# Patient Record
Sex: Male | Born: 1935 | Race: White | Hispanic: No | Marital: Married
Health system: Midwestern US, Academic
[De-identification: ages and names within clinical notes are randomized; demographics above are authoritative.]

---

## 2016-10-05 ENCOUNTER — Encounter: Admit: 2016-10-05 | Discharge: 2016-10-05 | Payer: MEDICARE

## 2016-10-05 DIAGNOSIS — I482 Chronic atrial fibrillation, unspecified: ICD-10-CM

## 2016-10-05 DIAGNOSIS — Z7901 Long term (current) use of anticoagulants: Principal | ICD-10-CM

## 2016-10-05 NOTE — Progress Notes
Pt denies any change in medication or diet to explain reason for elevated INR.  He says he takes tylenol on occasion for back pain but that is not a change.    Will decrease coumadin by 10% as per protocol and have him take 5mg  on MW (instead of MWF) with 2.5mg  all other days.     He was not really wanting to check INR again in a week, but would consider 2 weeks.      Dosage of coumadin verified, pt verbalized understanding of instructions and when next INR is due to be drawn.

## 2016-10-19 ENCOUNTER — Encounter: Admit: 2016-10-19 | Discharge: 2016-10-19 | Payer: MEDICARE

## 2016-11-02 ENCOUNTER — Encounter: Admit: 2016-11-02 | Discharge: 2016-11-02 | Payer: MEDICARE

## 2016-11-23 ENCOUNTER — Encounter: Admit: 2016-11-23 | Discharge: 2016-11-23 | Payer: MEDICARE

## 2016-12-03 ENCOUNTER — Encounter: Admit: 2016-12-03 | Discharge: 2016-12-03 | Payer: MEDICARE

## 2016-12-03 MED ORDER — WARFARIN 5 MG PO TAB
ORAL_TABLET | Freq: Every day | ORAL | 3 refills | 90.00000 days | Status: AC
Start: 2016-12-03 — End: 2018-02-10

## 2016-12-21 ENCOUNTER — Encounter: Admit: 2016-12-21 | Discharge: 2016-12-21 | Payer: MEDICARE

## 2017-01-15 ENCOUNTER — Encounter: Admit: 2017-01-15 | Discharge: 2017-01-15 | Payer: MEDICARE

## 2017-01-15 MED ORDER — METOPROLOL TARTRATE 25 MG PO TAB
ORAL_TABLET | Freq: Two times a day (BID) | ORAL | 3 refills | 90.00000 days | Status: AC
Start: 2017-01-15 — End: 2018-01-16

## 2017-01-18 ENCOUNTER — Encounter: Admit: 2017-01-18 | Discharge: 2017-01-18 | Payer: MEDICARE

## 2017-01-18 NOTE — Progress Notes
LM for pt relative to results, dosage and date of next INR. Advised to call if questions or concerns.

## 2017-01-21 ENCOUNTER — Encounter: Admit: 2017-01-21 | Discharge: 2017-01-21 | Payer: MEDICARE

## 2017-01-21 LAB — COMPREHENSIVE METABOLIC PANEL
Lab: 0.9 — ABNORMAL LOW (ref 33.0–37.0)
Lab: 1.2 — ABNORMAL HIGH (ref 0.0–1.0)
Lab: 131
Lab: 138 — ABNORMAL LOW (ref 4.70–6.10)
Lab: 16
Lab: 3.5
Lab: 3.9
Lab: 7.1
Lab: 9 — ABNORMAL HIGH (ref 27.0–31.0)
Lab: 9.3
Lab: 96

## 2017-01-21 LAB — MYOGLOBIN-CHEM: Lab: 80 — ABNORMAL HIGH (ref 80.0–99.0)

## 2017-01-21 LAB — TROPONIN-I

## 2017-01-21 LAB — CBC: Lab: 8.7

## 2017-01-21 NOTE — Telephone Encounter
Wife called asking for a pacemaker check because pt does not think his pacemaker is working. Wife reports patient is having new onset of shortness of breath and he has not felt vibration from his pacemaker recently and pt is concerned.  His shortness of breath started this morning and increases with ambulation.  There is not a provider at our [facility] or [place] office today so I advised wife patient should be evaluated in the ED or PCP's office as soon as possible.   Wife denies fever, edema or chest pain.   I offered pt a device check at another location, but wife states they can not drive far.  Wife states she will call PCP's office to see if she can get him evaluated, otherwise she will consider taking him to the ER.  I did schedule device check in the [place] office for Friday.  Wife verbalizes understanding and agrees with plan of care.

## 2017-01-22 ENCOUNTER — Encounter: Admit: 2017-01-22 | Discharge: 2017-01-22 | Payer: MEDICARE

## 2017-02-05 ENCOUNTER — Encounter: Admit: 2017-02-05 | Discharge: 2017-02-05 | Payer: MEDICARE

## 2017-02-05 ENCOUNTER — Ambulatory Visit: Admit: 2017-02-05 | Discharge: 2017-02-06 | Payer: MEDICARE

## 2017-02-05 DIAGNOSIS — I495 Sick sinus syndrome: Principal | ICD-10-CM

## 2017-02-05 DIAGNOSIS — Z950 Presence of cardiac pacemaker: ICD-10-CM

## 2017-02-08 ENCOUNTER — Encounter: Admit: 2017-02-08 | Discharge: 2017-02-08 | Payer: MEDICARE

## 2017-02-08 DIAGNOSIS — I482 Chronic atrial fibrillation, unspecified: Principal | ICD-10-CM

## 2017-02-12 ENCOUNTER — Ambulatory Visit: Admit: 2017-02-12 | Discharge: 2017-02-13 | Payer: MEDICARE

## 2017-02-12 ENCOUNTER — Encounter: Admit: 2017-02-12 | Discharge: 2017-02-12 | Payer: MEDICARE

## 2017-02-12 DIAGNOSIS — C4490 Unspecified malignant neoplasm of skin, unspecified: ICD-10-CM

## 2017-02-12 DIAGNOSIS — H9190 Unspecified hearing loss, unspecified ear: ICD-10-CM

## 2017-02-12 DIAGNOSIS — Z950 Presence of cardiac pacemaker: ICD-10-CM

## 2017-02-12 DIAGNOSIS — I10 Essential (primary) hypertension: ICD-10-CM

## 2017-02-12 DIAGNOSIS — Z7901 Long term (current) use of anticoagulants: ICD-10-CM

## 2017-02-12 DIAGNOSIS — M1990 Unspecified osteoarthritis, unspecified site: Principal | ICD-10-CM

## 2017-02-12 DIAGNOSIS — K219 Gastro-esophageal reflux disease without esophagitis: ICD-10-CM

## 2017-02-12 DIAGNOSIS — I495 Sick sinus syndrome: ICD-10-CM

## 2017-02-12 DIAGNOSIS — H547 Unspecified visual loss: ICD-10-CM

## 2017-02-12 DIAGNOSIS — I480 Paroxysmal atrial fibrillation: Principal | ICD-10-CM

## 2017-02-15 ENCOUNTER — Encounter: Admit: 2017-02-15 | Discharge: 2017-02-15 | Payer: MEDICARE

## 2017-02-15 DIAGNOSIS — Z7901 Long term (current) use of anticoagulants: Principal | ICD-10-CM

## 2017-02-15 DIAGNOSIS — I480 Paroxysmal atrial fibrillation: ICD-10-CM

## 2017-03-15 ENCOUNTER — Encounter: Admit: 2017-03-15 | Discharge: 2017-03-15 | Payer: MEDICARE

## 2017-03-15 DIAGNOSIS — I480 Paroxysmal atrial fibrillation: ICD-10-CM

## 2017-03-15 DIAGNOSIS — Z7901 Long term (current) use of anticoagulants: Principal | ICD-10-CM

## 2017-04-24 ENCOUNTER — Encounter: Admit: 2017-04-24 | Discharge: 2017-04-24 | Payer: MEDICARE

## 2017-04-24 DIAGNOSIS — I480 Paroxysmal atrial fibrillation: ICD-10-CM

## 2017-04-24 DIAGNOSIS — Z7901 Long term (current) use of anticoagulants: Principal | ICD-10-CM

## 2017-05-10 ENCOUNTER — Encounter: Admit: 2017-05-10 | Discharge: 2017-05-10 | Payer: MEDICARE

## 2017-05-10 DIAGNOSIS — Z7901 Long term (current) use of anticoagulants: Secondary | ICD-10-CM

## 2017-05-10 DIAGNOSIS — I480 Paroxysmal atrial fibrillation: ICD-10-CM

## 2017-05-10 DIAGNOSIS — I482 Chronic atrial fibrillation, unspecified: ICD-10-CM

## 2017-05-10 LAB — PROTIME INR (PT): Lab: 2.8

## 2017-06-07 ENCOUNTER — Encounter: Admit: 2017-06-07 | Discharge: 2017-06-07 | Payer: MEDICARE

## 2017-06-07 DIAGNOSIS — I482 Chronic atrial fibrillation, unspecified: Principal | ICD-10-CM

## 2017-06-07 DIAGNOSIS — Z7901 Long term (current) use of anticoagulants: ICD-10-CM

## 2017-06-07 LAB — PROTIME INR (PT): Lab: 2.5

## 2017-07-05 ENCOUNTER — Encounter: Admit: 2017-07-05 | Discharge: 2017-07-05 | Payer: MEDICARE

## 2017-08-09 ENCOUNTER — Encounter: Admit: 2017-08-09 | Discharge: 2017-08-09 | Payer: MEDICARE

## 2017-08-09 DIAGNOSIS — I480 Paroxysmal atrial fibrillation: ICD-10-CM

## 2017-08-09 DIAGNOSIS — Z7901 Long term (current) use of anticoagulants: Principal | ICD-10-CM

## 2017-08-09 LAB — PROTIME INR (PT): Lab: 2.5

## 2017-08-15 ENCOUNTER — Encounter: Admit: 2017-08-15 | Discharge: 2017-08-15 | Payer: MEDICARE

## 2017-08-15 DIAGNOSIS — I4891 Unspecified atrial fibrillation: Principal | ICD-10-CM

## 2017-09-06 ENCOUNTER — Encounter: Admit: 2017-09-06 | Discharge: 2017-09-06 | Payer: MEDICARE

## 2017-09-06 DIAGNOSIS — I4891 Unspecified atrial fibrillation: Principal | ICD-10-CM

## 2017-09-06 LAB — PROTIME INR (PT): Lab: 3.7 mg/dL

## 2017-09-13 ENCOUNTER — Encounter: Admit: 2017-09-13 | Discharge: 2017-09-13 | Payer: MEDICARE

## 2017-09-13 DIAGNOSIS — I4891 Unspecified atrial fibrillation: Principal | ICD-10-CM

## 2017-09-13 LAB — PROTIME INR (PT): Lab: 2.3

## 2017-09-19 ENCOUNTER — Encounter: Admit: 2017-09-19 | Discharge: 2017-09-19 | Payer: MEDICARE

## 2017-09-19 DIAGNOSIS — Z7901 Long term (current) use of anticoagulants: Principal | ICD-10-CM

## 2017-09-19 DIAGNOSIS — I480 Paroxysmal atrial fibrillation: ICD-10-CM

## 2017-09-19 MED ORDER — WARFARIN 1 MG PO TAB
1 mg | ORAL_TABLET | Freq: Every day | ORAL | 3 refills | 90.00000 days | Status: AC
Start: 2017-09-19 — End: ?

## 2017-10-07 ENCOUNTER — Encounter: Admit: 2017-10-07 | Discharge: 2017-10-07 | Payer: MEDICARE

## 2017-10-07 DIAGNOSIS — I4891 Unspecified atrial fibrillation: Principal | ICD-10-CM

## 2017-10-07 LAB — PROTIME INR (PT): Lab: 2.2 % (ref >60)

## 2017-11-04 LAB — PROTIME INR (PT): Lab: 2.8 mg/dL

## 2017-11-15 ENCOUNTER — Encounter: Admit: 2017-11-15 | Discharge: 2017-11-15 | Payer: MEDICARE

## 2017-11-21 ENCOUNTER — Encounter: Admit: 2017-11-21 | Discharge: 2017-11-21 | Payer: MEDICARE

## 2017-11-21 DIAGNOSIS — I4891 Unspecified atrial fibrillation: Principal | ICD-10-CM

## 2017-11-21 DIAGNOSIS — Z7901 Long term (current) use of anticoagulants: Principal | ICD-10-CM

## 2017-11-21 DIAGNOSIS — I480 Paroxysmal atrial fibrillation: ICD-10-CM

## 2017-11-21 LAB — PROTIME INR (PT): Lab: 2.1 mg/dL (ref 0.2–1.3)

## 2017-12-19 LAB — PROTIME INR (PT): Lab: 2

## 2017-12-20 ENCOUNTER — Encounter: Admit: 2017-12-20 | Discharge: 2017-12-20 | Payer: MEDICARE

## 2017-12-20 DIAGNOSIS — I4891 Unspecified atrial fibrillation: Principal | ICD-10-CM

## 2017-12-24 ENCOUNTER — Ambulatory Visit: Admit: 2017-12-24 | Discharge: 2017-12-25 | Payer: MEDICARE

## 2017-12-24 ENCOUNTER — Encounter: Admit: 2017-12-24 | Discharge: 2017-12-24 | Payer: MEDICARE

## 2017-12-24 DIAGNOSIS — I495 Sick sinus syndrome: Principal | ICD-10-CM

## 2017-12-24 DIAGNOSIS — Z950 Presence of cardiac pacemaker: ICD-10-CM

## 2018-01-14 ENCOUNTER — Encounter: Admit: 2018-01-14 | Discharge: 2018-01-14 | Payer: MEDICARE

## 2018-01-14 ENCOUNTER — Ambulatory Visit: Admit: 2018-01-14 | Discharge: 2018-01-15 | Payer: MEDICARE

## 2018-01-14 DIAGNOSIS — M1990 Unspecified osteoarthritis, unspecified site: Principal | ICD-10-CM

## 2018-01-14 DIAGNOSIS — Z950 Presence of cardiac pacemaker: ICD-10-CM

## 2018-01-14 DIAGNOSIS — I10 Essential (primary) hypertension: ICD-10-CM

## 2018-01-14 DIAGNOSIS — I495 Sick sinus syndrome: ICD-10-CM

## 2018-01-14 DIAGNOSIS — H9190 Unspecified hearing loss, unspecified ear: ICD-10-CM

## 2018-01-14 DIAGNOSIS — Z7901 Long term (current) use of anticoagulants: ICD-10-CM

## 2018-01-14 DIAGNOSIS — K219 Gastro-esophageal reflux disease without esophagitis: ICD-10-CM

## 2018-01-14 DIAGNOSIS — C4490 Unspecified malignant neoplasm of skin, unspecified: ICD-10-CM

## 2018-01-14 DIAGNOSIS — H547 Unspecified visual loss: ICD-10-CM

## 2018-01-14 DIAGNOSIS — I480 Paroxysmal atrial fibrillation: Principal | ICD-10-CM

## 2018-01-16 ENCOUNTER — Encounter: Admit: 2018-01-16 | Discharge: 2018-01-16 | Payer: MEDICARE

## 2018-01-16 MED ORDER — METOPROLOL TARTRATE 25 MG PO TAB
ORAL_TABLET | Freq: Two times a day (BID) | ORAL | 3 refills | 90.00000 days | Status: AC
Start: 2018-01-16 — End: 2019-01-07

## 2018-01-17 ENCOUNTER — Encounter: Admit: 2018-01-17 | Discharge: 2018-01-17 | Payer: MEDICARE

## 2018-01-24 LAB — PROTIME INR (PT): Lab: 2.2 mg/dL (ref 0.4–1.00)

## 2018-01-27 ENCOUNTER — Encounter: Admit: 2018-01-27 | Discharge: 2018-01-27 | Payer: MEDICARE

## 2018-01-27 DIAGNOSIS — Z7901 Long term (current) use of anticoagulants: Principal | ICD-10-CM

## 2018-01-27 DIAGNOSIS — I480 Paroxysmal atrial fibrillation: ICD-10-CM

## 2018-01-27 DIAGNOSIS — I4891 Unspecified atrial fibrillation: Principal | ICD-10-CM

## 2018-02-10 ENCOUNTER — Encounter: Admit: 2018-02-10 | Discharge: 2018-02-10 | Payer: MEDICARE

## 2018-02-10 MED ORDER — WARFARIN 5 MG PO TAB
ORAL_TABLET | Freq: Every day | ORAL | 3 refills | 90.00000 days | Status: AC
Start: 2018-02-10 — End: 2019-08-11

## 2018-02-21 ENCOUNTER — Encounter: Admit: 2018-02-21 | Discharge: 2018-02-21 | Payer: MEDICARE

## 2018-02-21 DIAGNOSIS — I4891 Unspecified atrial fibrillation: Principal | ICD-10-CM

## 2018-02-21 LAB — PROTIME INR (PT): Lab: 2.2

## 2018-03-21 ENCOUNTER — Encounter: Admit: 2018-03-21 | Discharge: 2018-03-21 | Payer: MEDICARE

## 2018-03-28 ENCOUNTER — Encounter: Admit: 2018-03-28 | Discharge: 2018-03-28 | Payer: MEDICARE

## 2018-03-28 DIAGNOSIS — I4891 Unspecified atrial fibrillation: Principal | ICD-10-CM

## 2018-03-28 LAB — PROTIME INR (PT): Lab: 2.2 MMOL/L — ABNORMAL LOW (ref 98–110)

## 2018-04-11 ENCOUNTER — Encounter: Admit: 2018-04-11 | Discharge: 2018-04-11 | Payer: MEDICARE

## 2018-04-11 LAB — PROTIME INR (PT): Lab: 2.8

## 2018-05-01 ENCOUNTER — Encounter: Admit: 2018-05-01 | Discharge: 2018-05-01 | Payer: MEDICARE

## 2018-05-01 DIAGNOSIS — I4891 Unspecified atrial fibrillation: Secondary | ICD-10-CM

## 2018-05-01 LAB — PROTIME INR (PT): Lab: 2.6 K/UL (ref 150–400)

## 2018-06-06 ENCOUNTER — Encounter: Admit: 2018-06-06 | Discharge: 2018-06-06 | Payer: MEDICARE

## 2018-06-06 DIAGNOSIS — I4891 Unspecified atrial fibrillation: Principal | ICD-10-CM

## 2018-06-06 LAB — PROTIME INR (PT): Lab: 2.7

## 2018-07-04 ENCOUNTER — Encounter: Admit: 2018-07-04 | Discharge: 2018-07-04 | Payer: MEDICARE

## 2018-08-06 ENCOUNTER — Encounter: Admit: 2018-08-06 | Discharge: 2018-08-06 | Payer: MEDICARE

## 2018-08-06 DIAGNOSIS — Z7901 Long term (current) use of anticoagulants: Principal | ICD-10-CM

## 2018-08-06 DIAGNOSIS — I480 Paroxysmal atrial fibrillation: ICD-10-CM

## 2018-08-15 ENCOUNTER — Encounter: Admit: 2018-08-15 | Discharge: 2018-08-15 | Payer: MEDICARE

## 2018-08-22 ENCOUNTER — Encounter: Admit: 2018-08-22 | Discharge: 2018-08-22 | Payer: MEDICARE

## 2018-08-22 DIAGNOSIS — I480 Paroxysmal atrial fibrillation: ICD-10-CM

## 2018-08-22 DIAGNOSIS — Z7901 Long term (current) use of anticoagulants: Secondary | ICD-10-CM

## 2018-08-22 LAB — PROTIME INR (PT): Lab: 4.4

## 2018-08-29 ENCOUNTER — Encounter: Admit: 2018-08-29 | Discharge: 2018-08-29 | Payer: MEDICARE

## 2018-08-29 DIAGNOSIS — I4891 Unspecified atrial fibrillation: ICD-10-CM

## 2018-08-29 DIAGNOSIS — Z7901 Long term (current) use of anticoagulants: Principal | ICD-10-CM

## 2018-08-29 DIAGNOSIS — I480 Paroxysmal atrial fibrillation: ICD-10-CM

## 2018-08-29 LAB — PROTIME INR (PT): Lab: 2.3

## 2018-09-02 ENCOUNTER — Encounter: Admit: 2018-09-02 | Discharge: 2018-09-02 | Payer: MEDICARE

## 2018-09-02 ENCOUNTER — Ambulatory Visit: Admit: 2018-09-02 | Discharge: 2018-09-03 | Payer: MEDICARE

## 2018-09-02 DIAGNOSIS — Z950 Presence of cardiac pacemaker: ICD-10-CM

## 2018-09-02 DIAGNOSIS — I495 Sick sinus syndrome: Principal | ICD-10-CM

## 2018-09-16 ENCOUNTER — Ambulatory Visit: Admit: 2018-09-16 | Discharge: 2018-09-17

## 2018-09-16 ENCOUNTER — Encounter: Admit: 2018-09-16 | Discharge: 2018-09-16

## 2018-09-16 DIAGNOSIS — H547 Unspecified visual loss: Secondary | ICD-10-CM

## 2018-09-16 DIAGNOSIS — M1990 Unspecified osteoarthritis, unspecified site: Secondary | ICD-10-CM

## 2018-09-16 DIAGNOSIS — I10 Essential (primary) hypertension: Secondary | ICD-10-CM

## 2018-09-16 DIAGNOSIS — C4490 Unspecified malignant neoplasm of skin, unspecified: Secondary | ICD-10-CM

## 2018-09-16 DIAGNOSIS — Z950 Presence of cardiac pacemaker: Secondary | ICD-10-CM

## 2018-09-16 DIAGNOSIS — K219 Gastro-esophageal reflux disease without esophagitis: Secondary | ICD-10-CM

## 2018-09-16 DIAGNOSIS — H9190 Unspecified hearing loss, unspecified ear: Secondary | ICD-10-CM

## 2018-09-19 ENCOUNTER — Encounter: Admit: 2018-09-19 | Discharge: 2018-09-19

## 2018-09-19 DIAGNOSIS — I4891 Unspecified atrial fibrillation: Secondary | ICD-10-CM

## 2018-09-19 DIAGNOSIS — Z7901 Long term (current) use of anticoagulants: Secondary | ICD-10-CM

## 2018-10-17 LAB — PROTIME INR (PT): Lab: 2.6

## 2018-10-20 ENCOUNTER — Encounter: Admit: 2018-10-20 | Discharge: 2018-10-20

## 2018-10-20 DIAGNOSIS — I480 Paroxysmal atrial fibrillation: Secondary | ICD-10-CM

## 2018-10-20 DIAGNOSIS — Z7901 Long term (current) use of anticoagulants: Secondary | ICD-10-CM

## 2018-10-20 NOTE — Progress Notes
Spoke to pt re: INR results. Verified current Coumadin dose. Pt will continue current therapeutic dose and recheck in ~ 1 mo. Pt verbalized understanding and was agreeable to this plan. No further needs identified at this time.

## 2018-11-21 ENCOUNTER — Encounter: Admit: 2018-11-21 | Discharge: 2018-11-21

## 2018-11-21 DIAGNOSIS — Z7901 Long term (current) use of anticoagulants: Secondary | ICD-10-CM

## 2018-11-21 NOTE — Progress Notes
Pt denies any changes in diet or medications, reports no bleeding or other issues. Dosage of coumadin verified, pt verbalized understanding of instructions and when next INR is due to be drawn.

## 2018-12-19 LAB — PROTIME INR (PT): Lab: 1.7 — ABNORMAL LOW (ref 2.0–3.0)

## 2018-12-23 ENCOUNTER — Encounter: Admit: 2018-12-23 | Discharge: 2018-12-23

## 2018-12-23 DIAGNOSIS — Z7901 Long term (current) use of anticoagulants: Secondary | ICD-10-CM

## 2018-12-23 DIAGNOSIS — I480 Paroxysmal atrial fibrillation: Secondary | ICD-10-CM

## 2019-01-02 ENCOUNTER — Encounter: Admit: 2019-01-02 | Discharge: 2019-01-02 | Payer: MEDICARE

## 2019-01-02 LAB — PROTIME INR (PT): Lab: 2.9

## 2019-01-07 ENCOUNTER — Encounter: Admit: 2019-01-07 | Discharge: 2019-01-07 | Payer: MEDICARE

## 2019-01-07 MED ORDER — METOPROLOL TARTRATE 25 MG PO TAB
ORAL_TABLET | Freq: Two times a day (BID) | ORAL | 2 refills | 90.00000 days | Status: AC
Start: 2019-01-07 — End: ?

## 2019-01-29 ENCOUNTER — Ambulatory Visit: Admit: 2019-01-29 | Discharge: 2019-01-29 | Payer: MEDICARE

## 2019-01-29 ENCOUNTER — Encounter: Admit: 2019-01-29 | Discharge: 2019-01-29 | Payer: MEDICARE

## 2019-01-29 DIAGNOSIS — Z950 Presence of cardiac pacemaker: Secondary | ICD-10-CM

## 2019-01-29 DIAGNOSIS — I495 Sick sinus syndrome: Secondary | ICD-10-CM

## 2019-01-30 ENCOUNTER — Encounter: Admit: 2019-01-30 | Discharge: 2019-01-30 | Payer: MEDICARE

## 2019-01-30 DIAGNOSIS — I480 Paroxysmal atrial fibrillation: Secondary | ICD-10-CM

## 2019-01-30 DIAGNOSIS — Z7901 Long term (current) use of anticoagulants: Secondary | ICD-10-CM

## 2019-01-30 LAB — PROTIME INR (PT): Lab: 3.2

## 2019-02-13 ENCOUNTER — Encounter: Admit: 2019-02-13 | Discharge: 2019-02-13 | Payer: MEDICARE

## 2019-02-13 DIAGNOSIS — Z7901 Long term (current) use of anticoagulants: Secondary | ICD-10-CM

## 2019-02-13 DIAGNOSIS — I4891 Unspecified atrial fibrillation: Secondary | ICD-10-CM

## 2019-02-13 DIAGNOSIS — I480 Paroxysmal atrial fibrillation: Secondary | ICD-10-CM

## 2019-02-13 LAB — PROTIME INR (PT): Lab: 2.5

## 2019-02-13 NOTE — Progress Notes
INR 2.5, spoke to wife, con't 2.5mg  daily and recheck on 11/20. tc

## 2019-03-06 ENCOUNTER — Encounter: Admit: 2019-03-06 | Discharge: 2019-03-06 | Payer: MEDICARE

## 2019-03-06 DIAGNOSIS — I480 Paroxysmal atrial fibrillation: Secondary | ICD-10-CM

## 2019-03-06 DIAGNOSIS — Z7901 Long term (current) use of anticoagulants: Secondary | ICD-10-CM

## 2019-03-06 LAB — PROTIME INR (PT): Lab: 2.2

## 2019-03-06 NOTE — Progress Notes
03/06/2019 2:44 PM Reviewed INR and dosing with patient's spouse.  Continue current maintenance plan and recheck INR on 03/27/2019. Jacklynn Barnacle, RN

## 2019-03-24 ENCOUNTER — Encounter: Admit: 2019-03-24 | Discharge: 2019-03-24 | Payer: MEDICARE

## 2019-03-24 DIAGNOSIS — H9190 Unspecified hearing loss, unspecified ear: Secondary | ICD-10-CM

## 2019-03-24 DIAGNOSIS — I10 Essential (primary) hypertension: Secondary | ICD-10-CM

## 2019-03-24 DIAGNOSIS — H547 Unspecified visual loss: Secondary | ICD-10-CM

## 2019-03-24 DIAGNOSIS — M1990 Unspecified osteoarthritis, unspecified site: Secondary | ICD-10-CM

## 2019-03-24 DIAGNOSIS — C4490 Unspecified malignant neoplasm of skin, unspecified: Secondary | ICD-10-CM

## 2019-03-24 DIAGNOSIS — K219 Gastro-esophageal reflux disease without esophagitis: Secondary | ICD-10-CM

## 2019-03-27 ENCOUNTER — Encounter: Admit: 2019-03-27 | Discharge: 2019-03-27 | Payer: MEDICARE

## 2019-03-27 DIAGNOSIS — I480 Paroxysmal atrial fibrillation: Secondary | ICD-10-CM

## 2019-03-27 DIAGNOSIS — Z7901 Long term (current) use of anticoagulants: Secondary | ICD-10-CM

## 2019-03-27 LAB — PROTIME INR (PT): Lab: 2.1

## 2019-04-24 ENCOUNTER — Encounter: Admit: 2019-04-24 | Discharge: 2019-04-24 | Payer: MEDICARE

## 2019-04-24 DIAGNOSIS — Z7901 Long term (current) use of anticoagulants: Secondary | ICD-10-CM

## 2019-04-24 DIAGNOSIS — I4891 Unspecified atrial fibrillation: Secondary | ICD-10-CM

## 2019-04-24 NOTE — Progress Notes
04/24/2019 4:13 PM   Verified dose with spouse.

## 2019-05-22 ENCOUNTER — Encounter: Admit: 2019-05-22 | Discharge: 2019-05-22 | Payer: MEDICARE

## 2019-05-22 DIAGNOSIS — I4891 Unspecified atrial fibrillation: Secondary | ICD-10-CM

## 2019-05-22 DIAGNOSIS — Z7901 Long term (current) use of anticoagulants: Secondary | ICD-10-CM

## 2019-06-19 ENCOUNTER — Encounter: Admit: 2019-06-19 | Discharge: 2019-06-19 | Payer: MEDICARE

## 2019-06-19 DIAGNOSIS — Z7901 Long term (current) use of anticoagulants: Secondary | ICD-10-CM

## 2019-06-19 DIAGNOSIS — I480 Paroxysmal atrial fibrillation: Secondary | ICD-10-CM

## 2019-06-19 LAB — PROTIME INR (PT): Lab: 1.5 10*3/uL (ref 0–0.80)

## 2019-06-22 ENCOUNTER — Encounter: Admit: 2019-06-22 | Discharge: 2019-06-22 | Payer: MEDICARE

## 2019-06-22 DIAGNOSIS — I4891 Unspecified atrial fibrillation: Secondary | ICD-10-CM

## 2019-06-22 DIAGNOSIS — Z7901 Long term (current) use of anticoagulants: Secondary | ICD-10-CM

## 2019-06-22 LAB — PROTIME INR (PT): Lab: 1.8

## 2019-07-17 ENCOUNTER — Encounter: Admit: 2019-07-17 | Discharge: 2019-07-17 | Payer: MEDICARE

## 2019-07-17 NOTE — Progress Notes
INR Overdue.  Called and discussed with patient and spouse.  He will have INR checked next week.

## 2019-07-20 ENCOUNTER — Encounter: Admit: 2019-07-20 | Discharge: 2019-07-20 | Payer: MEDICARE

## 2019-07-20 DIAGNOSIS — I4891 Unspecified atrial fibrillation: Secondary | ICD-10-CM

## 2019-07-20 DIAGNOSIS — Z7901 Long term (current) use of anticoagulants: Secondary | ICD-10-CM

## 2019-07-20 LAB — PROTIME INR (PT): Lab: 2.7 pg (ref 26–34)

## 2019-08-11 MED ORDER — WARFARIN 5 MG PO TAB
ORAL_TABLET | Freq: Every day | ORAL | 2 refills | 90.00000 days | Status: AC
Start: 2019-08-11 — End: ?

## 2019-08-18 ENCOUNTER — Encounter: Admit: 2019-08-18 | Discharge: 2019-08-18 | Payer: MEDICARE

## 2019-09-08 ENCOUNTER — Ambulatory Visit: Admit: 2019-09-08 | Discharge: 2019-09-08 | Payer: MEDICARE

## 2019-09-08 ENCOUNTER — Encounter: Admit: 2019-09-08 | Discharge: 2019-09-08 | Payer: MEDICARE

## 2019-09-08 DIAGNOSIS — I495 Sick sinus syndrome: Secondary | ICD-10-CM

## 2019-09-08 DIAGNOSIS — Z950 Presence of cardiac pacemaker: Secondary | ICD-10-CM

## 2019-09-11 ENCOUNTER — Encounter: Admit: 2019-09-11 | Discharge: 2019-09-11 | Payer: MEDICARE

## 2019-09-11 DIAGNOSIS — I4891 Unspecified atrial fibrillation: Secondary | ICD-10-CM

## 2019-09-11 LAB — PROTIME INR (PT): Lab: 2.8 — ABNORMAL HIGH (ref 0.89–1.11)

## 2019-10-09 ENCOUNTER — Encounter: Admit: 2019-10-09 | Discharge: 2019-10-09 | Payer: MEDICARE

## 2019-10-09 DIAGNOSIS — I4891 Unspecified atrial fibrillation: Secondary | ICD-10-CM

## 2019-10-09 LAB — PROTIME INR (PT): Lab: 2.9 — ABNORMAL HIGH (ref 0.89–1.11)

## 2019-11-05 ENCOUNTER — Encounter: Admit: 2019-11-05 | Discharge: 2019-11-05 | Payer: MEDICARE

## 2019-11-05 DIAGNOSIS — I10 Essential (primary) hypertension: Secondary | ICD-10-CM

## 2019-11-05 DIAGNOSIS — M1990 Unspecified osteoarthritis, unspecified site: Secondary | ICD-10-CM

## 2019-11-05 DIAGNOSIS — C4490 Unspecified malignant neoplasm of skin, unspecified: Secondary | ICD-10-CM

## 2019-11-05 DIAGNOSIS — H9190 Unspecified hearing loss, unspecified ear: Secondary | ICD-10-CM

## 2019-11-05 DIAGNOSIS — H547 Unspecified visual loss: Secondary | ICD-10-CM

## 2019-11-05 DIAGNOSIS — K219 Gastro-esophageal reflux disease without esophagitis: Secondary | ICD-10-CM

## 2019-11-13 ENCOUNTER — Encounter: Admit: 2019-11-13 | Discharge: 2019-11-13 | Payer: MEDICARE

## 2019-11-13 DIAGNOSIS — I4891 Unspecified atrial fibrillation: Secondary | ICD-10-CM

## 2019-11-13 LAB — PROTIME INR (PT): Lab: 3.5 — ABNORMAL HIGH (ref 0.89–1.11)

## 2019-11-20 ENCOUNTER — Encounter: Admit: 2019-11-20 | Discharge: 2019-11-20 | Payer: MEDICARE

## 2019-11-20 DIAGNOSIS — I4891 Unspecified atrial fibrillation: Secondary | ICD-10-CM

## 2019-11-20 LAB — PROTIME INR (PT): Lab: 2.8

## 2019-12-11 ENCOUNTER — Encounter: Admit: 2019-12-11 | Discharge: 2019-12-11 | Payer: MEDICARE

## 2019-12-11 DIAGNOSIS — I4891 Unspecified atrial fibrillation: Secondary | ICD-10-CM

## 2019-12-11 LAB — PROTIME INR (PT): Lab: 2.7

## 2019-12-25 ENCOUNTER — Encounter: Admit: 2019-12-25 | Discharge: 2019-12-25 | Payer: MEDICARE

## 2019-12-25 DIAGNOSIS — Z7901 Long term (current) use of anticoagulants: Secondary | ICD-10-CM

## 2019-12-25 DIAGNOSIS — I4891 Unspecified atrial fibrillation: Secondary | ICD-10-CM

## 2019-12-25 DIAGNOSIS — I4811 Longstanding persistent atrial fibrillation: Secondary | ICD-10-CM

## 2019-12-25 LAB — PROTIME INR (PT): Lab: 1.8 % (ref 36–45)

## 2019-12-28 ENCOUNTER — Encounter: Admit: 2019-12-28 | Discharge: 2019-12-28 | Payer: MEDICARE

## 2019-12-28 MED ORDER — WARFARIN 1 MG PO TAB
ORAL_TABLET | Freq: Every day | ORAL | 0 refills | 90.00000 days | Status: AC
Start: 2019-12-28 — End: ?

## 2020-01-15 ENCOUNTER — Encounter: Admit: 2020-01-15 | Discharge: 2020-01-15 | Payer: MEDICARE

## 2020-01-15 DIAGNOSIS — I4891 Unspecified atrial fibrillation: Secondary | ICD-10-CM

## 2020-01-15 LAB — PROTIME INR (PT): Lab: 2.5

## 2020-02-12 ENCOUNTER — Encounter: Admit: 2020-02-12 | Discharge: 2020-02-12 | Payer: MEDICARE

## 2020-02-12 DIAGNOSIS — I4891 Unspecified atrial fibrillation: Secondary | ICD-10-CM

## 2020-02-12 LAB — PROTIME INR (PT): Lab: 2.6

## 2020-03-14 ENCOUNTER — Encounter: Admit: 2020-03-14 | Discharge: 2020-03-14 | Payer: MEDICARE

## 2020-03-14 DIAGNOSIS — Z7901 Long term (current) use of anticoagulants: Secondary | ICD-10-CM

## 2020-03-14 DIAGNOSIS — I4811 Longstanding persistent atrial fibrillation: Secondary | ICD-10-CM

## 2020-04-01 ENCOUNTER — Encounter: Admit: 2020-04-01 | Discharge: 2020-04-01 | Payer: MEDICARE

## 2020-04-01 DIAGNOSIS — I4891 Unspecified atrial fibrillation: Secondary | ICD-10-CM

## 2020-04-01 LAB — PROTIME INR (PT): Lab: 2.4

## 2020-04-12 ENCOUNTER — Encounter: Admit: 2020-04-12 | Discharge: 2020-04-12 | Payer: MEDICARE

## 2020-04-12 ENCOUNTER — Ambulatory Visit: Admit: 2020-04-12 | Discharge: 2020-04-12 | Payer: MEDICARE

## 2020-04-12 DIAGNOSIS — I495 Sick sinus syndrome: Secondary | ICD-10-CM

## 2020-04-12 DIAGNOSIS — Z950 Presence of cardiac pacemaker: Secondary | ICD-10-CM

## 2020-04-12 NOTE — Telephone Encounter
Pt called discussed with him and wife.  Denies problems instructed to call if issues.  They stated understanding

## 2020-04-12 NOTE — Telephone Encounter
-----   Message from Ishmael Holter, RN sent at 04/12/2020  2:44 PM CST -----  Regarding: FW: Please review NSVT events noted on rep check today with Dr. Geronimo Boot.    ----- Message -----  From: Cheri Guppy, RN  Sent: 04/12/2020   2:29 PM CST  To: Cvm Nurse Gen Card Team Yellow  Subject: Please review NSVT events noted on rep check#    TY

## 2020-05-06 ENCOUNTER — Encounter: Admit: 2020-05-06 | Discharge: 2020-05-06 | Payer: MEDICARE

## 2020-05-06 DIAGNOSIS — I4891 Unspecified atrial fibrillation: Secondary | ICD-10-CM

## 2020-05-06 LAB — PROTIME INR (PT): Lab: 2.8

## 2020-05-31 ENCOUNTER — Encounter

## 2020-05-31 DIAGNOSIS — M1990 Unspecified osteoarthritis, unspecified site: Secondary | ICD-10-CM

## 2020-05-31 DIAGNOSIS — C4490 Unspecified malignant neoplasm of skin, unspecified: Secondary | ICD-10-CM

## 2020-05-31 DIAGNOSIS — H9190 Unspecified hearing loss, unspecified ear: Secondary | ICD-10-CM

## 2020-05-31 DIAGNOSIS — Z950 Presence of cardiac pacemaker: Secondary | ICD-10-CM

## 2020-05-31 DIAGNOSIS — I10 Essential (primary) hypertension: Secondary | ICD-10-CM

## 2020-05-31 DIAGNOSIS — I495 Sick sinus syndrome: Secondary | ICD-10-CM

## 2020-05-31 DIAGNOSIS — I4891 Unspecified atrial fibrillation: Secondary | ICD-10-CM

## 2020-05-31 DIAGNOSIS — K219 Gastro-esophageal reflux disease without esophagitis: Secondary | ICD-10-CM

## 2020-05-31 DIAGNOSIS — H547 Unspecified visual loss: Secondary | ICD-10-CM

## 2020-05-31 NOTE — Patient Instructions
Have in office device check 06/30/20 at 1:30pm at [facility] clinic

## 2020-06-13 ENCOUNTER — Encounter: Admit: 2020-06-13 | Discharge: 2020-06-13 | Payer: MEDICARE

## 2020-06-13 DIAGNOSIS — I4891 Unspecified atrial fibrillation: Secondary | ICD-10-CM

## 2020-06-13 DIAGNOSIS — I10 Essential (primary) hypertension: Secondary | ICD-10-CM

## 2020-06-13 DIAGNOSIS — Z7901 Long term (current) use of anticoagulants: Secondary | ICD-10-CM

## 2020-06-13 LAB — PROTIME INR (PT): Lab: 2

## 2020-06-30 ENCOUNTER — Ambulatory Visit: Admit: 2020-06-30 | Discharge: 2020-06-30 | Payer: MEDICARE

## 2020-06-30 ENCOUNTER — Encounter: Admit: 2020-06-30 | Discharge: 2020-06-30 | Payer: MEDICARE

## 2020-06-30 DIAGNOSIS — I495 Sick sinus syndrome: Secondary | ICD-10-CM

## 2020-06-30 DIAGNOSIS — I10 Essential (primary) hypertension: Secondary | ICD-10-CM

## 2020-06-30 DIAGNOSIS — Z950 Presence of cardiac pacemaker: Secondary | ICD-10-CM

## 2020-06-30 DIAGNOSIS — I4891 Unspecified atrial fibrillation: Secondary | ICD-10-CM

## 2020-07-11 ENCOUNTER — Encounter: Admit: 2020-07-11 | Discharge: 2020-07-11 | Payer: MEDICARE

## 2020-07-11 DIAGNOSIS — I4891 Unspecified atrial fibrillation: Secondary | ICD-10-CM

## 2020-07-11 DIAGNOSIS — Z7901 Long term (current) use of anticoagulants: Secondary | ICD-10-CM

## 2020-07-11 DIAGNOSIS — I10 Essential (primary) hypertension: Secondary | ICD-10-CM

## 2020-07-11 LAB — PROTIME INR (PT): Lab: 2

## 2020-08-09 ENCOUNTER — Encounter: Admit: 2020-08-09 | Discharge: 2020-08-09 | Payer: MEDICARE

## 2020-08-09 DIAGNOSIS — I10 Essential (primary) hypertension: Secondary | ICD-10-CM

## 2020-08-09 DIAGNOSIS — Z7901 Long term (current) use of anticoagulants: Secondary | ICD-10-CM

## 2020-08-09 DIAGNOSIS — I4891 Unspecified atrial fibrillation: Secondary | ICD-10-CM

## 2020-08-09 LAB — PROTIME INR (PT): INR: 2.3 g/dL (ref 6.0–8.0)

## 2020-09-02 ENCOUNTER — Encounter: Admit: 2020-09-02 | Discharge: 2020-09-02 | Payer: MEDICARE

## 2020-09-02 DIAGNOSIS — I10 Essential (primary) hypertension: Secondary | ICD-10-CM

## 2020-09-02 DIAGNOSIS — Z7901 Long term (current) use of anticoagulants: Secondary | ICD-10-CM

## 2020-09-02 DIAGNOSIS — I4891 Unspecified atrial fibrillation: Secondary | ICD-10-CM

## 2020-09-02 LAB — PROTIME INR (PT): INR: 2

## 2020-10-03 ENCOUNTER — Encounter: Admit: 2020-10-03 | Discharge: 2020-10-03 | Payer: MEDICARE

## 2020-10-03 DIAGNOSIS — Z7901 Long term (current) use of anticoagulants: Secondary | ICD-10-CM

## 2020-10-03 DIAGNOSIS — I4891 Unspecified atrial fibrillation: Secondary | ICD-10-CM

## 2020-10-05 ENCOUNTER — Encounter: Admit: 2020-10-05 | Discharge: 2020-10-05 | Payer: MEDICARE

## 2020-10-05 DIAGNOSIS — I4891 Unspecified atrial fibrillation: Secondary | ICD-10-CM

## 2020-10-05 DIAGNOSIS — Z7901 Long term (current) use of anticoagulants: Secondary | ICD-10-CM

## 2020-10-05 DIAGNOSIS — I10 Essential (primary) hypertension: Secondary | ICD-10-CM

## 2020-10-05 LAB — PROTIME INR (PT): INR: 2 % (ref 0–2)

## 2020-11-01 ENCOUNTER — Encounter: Admit: 2020-11-01 | Discharge: 2020-11-01 | Payer: MEDICARE

## 2020-11-01 DIAGNOSIS — Z7901 Long term (current) use of anticoagulants: Secondary | ICD-10-CM

## 2020-11-01 DIAGNOSIS — I4891 Unspecified atrial fibrillation: Secondary | ICD-10-CM

## 2020-11-01 DIAGNOSIS — I10 Essential (primary) hypertension: Secondary | ICD-10-CM

## 2020-11-01 LAB — PROTIME INR (PT): INR: 3.7 MMOL/L — ABNORMAL HIGH (ref 98–110)

## 2020-11-08 ENCOUNTER — Encounter: Admit: 2020-11-08 | Discharge: 2020-11-08 | Payer: MEDICARE

## 2020-11-08 ENCOUNTER — Ambulatory Visit: Admit: 2020-11-08 | Discharge: 2020-11-08 | Payer: MEDICARE

## 2020-11-08 DIAGNOSIS — H9190 Unspecified hearing loss, unspecified ear: Secondary | ICD-10-CM

## 2020-11-08 DIAGNOSIS — Z7901 Long term (current) use of anticoagulants: Secondary | ICD-10-CM

## 2020-11-08 DIAGNOSIS — K219 Gastro-esophageal reflux disease without esophagitis: Secondary | ICD-10-CM

## 2020-11-08 DIAGNOSIS — H547 Unspecified visual loss: Secondary | ICD-10-CM

## 2020-11-08 DIAGNOSIS — I10 Essential (primary) hypertension: Secondary | ICD-10-CM

## 2020-11-08 DIAGNOSIS — I4891 Unspecified atrial fibrillation: Secondary | ICD-10-CM

## 2020-11-08 DIAGNOSIS — Z950 Presence of cardiac pacemaker: Secondary | ICD-10-CM

## 2020-11-08 DIAGNOSIS — I495 Sick sinus syndrome: Secondary | ICD-10-CM

## 2020-11-08 DIAGNOSIS — M1990 Unspecified osteoarthritis, unspecified site: Secondary | ICD-10-CM

## 2020-11-08 DIAGNOSIS — C4490 Unspecified malignant neoplasm of skin, unspecified: Secondary | ICD-10-CM

## 2020-11-08 NOTE — Telephone Encounter
Pt needs gen change. He does not have an echo on file. Asked Richardson Landry if he can coordinate echo soon and to let me know when he is scheduled so I can schedule gen change after that.

## 2020-11-08 NOTE — Progress Notes
Date of Service: 11/08/2020    Andrew Frank is a 87 y.o. male.       HPI     I saw the Andrew Frank in the [place] clinic for a work in visit today.  His pacing generator has reached EOL and we are scheduling him for replacement.  The device has reverted to a VVI mode and he has been symptomatic in this regard for about 10 days.  He has noticed some increased breathlessness, but no syncope or near syncope.  He has had perhaps a little bit of fluid retention with some ankle edema but no problems with increased breathlessness.       Vitals:    11/08/20 1348   BP: 122/76   BP Source: Arm, Left Upper   Pulse: 55   SpO2: 97%   O2 Device: None (Room air)   PainSc: Zero   Weight: 84.4 kg (186 lb)   Height: 175.3 cm (5' 9)     Body mass index is 27.47 kg/m?Andrew Frank     Past Medical History  Patient Active Problem List    Diagnosis Date Noted   ? Left shoulder pain 07/11/2016   ? Chronic anticoagulation 04/01/2014   ? Long term (current) use of anticoagulants 04/01/2014   ? 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 Atrial fibrillation (HCC) 03/31/2014   ? Sinus node dysfunction (HCC) 03/31/2014     [employer] PM      ? Degenerative arthritis 03/31/2014   ? GERD (gastroesophageal reflux disease) 03/31/2014   ? Hearing impairment 03/31/2014   ? Hypertension 03/31/2014   ? Skin cancer 03/31/2014   ? Visual impairment 03/31/2014         Review of Systems   Constitutional: Negative.   HENT: Negative.    Eyes: Negative.    Cardiovascular: Positive for dyspnea on exertion.   Respiratory: Negative.    Endocrine: Negative.    Hematologic/Lymphatic: Negative.    Skin: Negative.    Musculoskeletal: Negative.    Gastrointestinal: Negative.    Genitourinary: Negative.    Neurological: Negative.    Psychiatric/Behavioral: Negative.    Allergic/Immunologic: Negative.        Physical Exam    Physical Exam   General Appearance: no distress   Skin: warm, no ulcers or xanthomas   Digits and Nails: no cyanosis or clubbing   Eyes: conjunctivae and lids normal, pupils are equal and round   Teeth/Gums/Palate: dentition unremarkable, no lesions   Lips & Oral Mucosa: no pallor or cyanosis   Neck Veins: normal JVP , neck veins are not distended   Thyroid: no nodules, masses, tenderness or enlargement   Chest Inspection: chest is normal in appearance   Respiratory Effort: breathing comfortably, no respiratory distress   Auscultation/Percussion: lungs clear to auscultation, no rales or rhonchi, no wheezing   PMI: PMI not enlarged or displaced   Cardiac Rhythm: irregular rhythm and normal rate   Cardiac Auscultation: S1, S2 normal, no rub, no gallop   Murmurs: no murmur   Peripheral Circulation: normal peripheral circulation   Carotid Arteries: normal carotid upstroke bilaterally, no bruits   Radial Arteries: normal symmetric radial pulses   Abdominal Aorta: no abdominal aortic bruit   Pedal Pulses: normal symmetric pedal pulses   Lower Extremity Edema: no lower extremity edema   Abdominal Exam: soft, non-tender, no masses, bowel sounds normal   Liver & Spleen: no organomegaly   Gait & Station: walks without assistance   Muscle Strength: normal muscle tone   Orientation:  oriented to time, place and person   Affect & Mood: appropriate and sustained affect   Language and Memory: patient responsive and seems to comprehend information   Neurologic Exam: neurological assessment grossly intact   Other: moves all extremities      Cardiovascular Studies    EKG:  Atrial fibrillation with some intrinsic QRS complexes and mostly paced ventricular beats    Cardiovascular Health Factors  Vitals BP Readings from Last 3 Encounters:   11/08/20 122/76   05/31/20 132/78   11/05/19 118/76     Wt Readings from Last 3 Encounters:   11/08/20 84.4 kg (186 lb)   05/31/20 83.4 kg (183 lb 12.8 oz)   11/05/19 82.1 kg (181 lb)     BMI Readings from Last 3 Encounters:   11/08/20 27.47 kg/m?   05/31/20 27.14 kg/m?   11/05/19 26.73 kg/m?      Smoking Social History     Tobacco Use   Smoking Status Never Smoker Smokeless Tobacco Never Used      Lipid Profile Cholesterol   Date Value Ref Range Status   11/16/2019 176  Final     HDL   Date Value Ref Range Status   11/16/2019 50  Final     LDL   Date Value Ref Range Status   11/16/2019 114 (H) <100 Final     Triglycerides   Date Value Ref Range Status   11/16/2019 62  Final      Blood Sugar No results found for: HGBA1C  Glucose   Date Value Ref Range Status   09/30/2020 147 (H) 70 - 105 Final   12/23/2019 119 (H) 70 - 105 Final   01/21/2017 96  Final          Problems Addressed Today  Encounter Diagnoses   Name Primary?   ? Atrial fibrillation, unspecified type (HCC) Yes   ? Long term (current) use of anticoagulants    ? Primary hypertension    ? Pacemaker        Assessment and Plan       Pacemaker  His device has reached elective replacement indicator and we will be scheduling him for a generator replacement in the near future.  We talked the procedure through today because he has not gone through a generator replacement yet.    Atrial fibrillation (HCC)  His device check today suggests that the underlying rhythm may well be atrial fibrillation.    Hypertension  BP looks OK on current medical therapy.      Current Medications (including today's revisions)  ? aspirin EC 81 mg tablet Take 1 tablet by mouth daily. Take with food.   ? metoprolol tartrate (LOPRESSOR) 25 mg tablet Take 1 tablet by mouth twice daily   ? nitrofurantoin monohyd/m-cryst (MACROBID) 100 mg capsule Take 100 mg by mouth daily.   ? nitroglycerin (NITROSTAT) 0.4 mg tablet Place 1 tablet under tongue every 5 minutes as needed for Chest Pain. Max of 3 tablets, call 911.   ? tamsulosin (FLOMAX) 0.4 mg capsule Take 0.4 mg by mouth daily. Do not crush, chew or open capsules. Take 30 minutes following the same meal each day.   ? warfarin (COUMADIN) 1 mg tablet TAKE 1 TABLET BY MOUTH ONCE DAILY OR AS DIRECTED BY  CARDIOLOGY   ? warfarin (COUMADIN) 5 mg tablet Take 1 tablet by mouth once daily     Total time spent on today's office visit was 30 minutes.  This includes face-to-face in  person visit with patient as well as nonface-to-face time including review of the EMR, outside records, labs, radiologic studies, echocardiogram & other cardiovascular studies, formation of treatment plan, after visit summary, future disposition, and lastly on documentation.

## 2020-11-08 NOTE — Assessment & Plan Note
His device check today suggests that the underlying rhythm may well be atrial fibrillation.

## 2020-11-08 NOTE — Assessment & Plan Note
BP looks OK on current medical therapy.

## 2020-11-08 NOTE — Assessment & Plan Note
His device has reached elective replacement indicator and we will be scheduling him for a generator replacement in the near future.  We talked the procedure through today because he has not gone through a generator replacement yet.

## 2020-11-08 NOTE — Telephone Encounter
-----   Message from Richard Miu, RN sent at 11/08/2020  2:39 PM CDT -----  Regarding: FW: NEEDS GEN Change    ----- Message -----  From: Asencion Noble  Sent: 11/08/2020   1:53 PM CDT  To: Cvm Nurse Ep  Subject: NEEDS GEN Change                                 PT ERI in clinic VVR not feeling well.  Updating H&P with SDO Please schedule for Gen change.  Let me know if any additional needed.    Thanks  Richardson Landry

## 2020-11-09 ENCOUNTER — Encounter: Admit: 2020-11-09 | Discharge: 2020-11-09 | Payer: MEDICARE

## 2020-11-09 DIAGNOSIS — I10 Essential (primary) hypertension: Secondary | ICD-10-CM

## 2020-11-09 DIAGNOSIS — I4891 Unspecified atrial fibrillation: Secondary | ICD-10-CM

## 2020-11-09 DIAGNOSIS — Z7901 Long term (current) use of anticoagulants: Secondary | ICD-10-CM

## 2020-11-10 ENCOUNTER — Encounter: Admit: 2020-11-10 | Discharge: 2020-11-10 | Payer: MEDICARE

## 2020-11-10 DIAGNOSIS — I495 Sick sinus syndrome: Secondary | ICD-10-CM

## 2020-11-10 DIAGNOSIS — Z45010 Encounter for checking and testing of cardiac pacemaker pulse generator [battery]: Secondary | ICD-10-CM

## 2020-11-10 MED ORDER — SODIUM CHLORIDE 0.9 % IV SOLP
INTRAVENOUS | 0 refills
Start: 2020-11-10 — End: ?

## 2020-11-10 MED ORDER — LIDOCAINE (PF) 10 MG/ML (1 %) IJ SOLN
.2 mL | INTRAMUSCULAR | 0 refills | PRN
Start: 2020-11-10 — End: ?

## 2020-11-10 MED ORDER — CEFAZOLIN INJ 1GM IVP
2 g | Freq: Once | INTRAVENOUS | 0 refills
Start: 2020-11-10 — End: ?

## 2020-11-10 NOTE — Progress Notes
Medicare is listed as patient's primary insurance coverage.  Pre-certification is not required for hospitalizations.

## 2020-11-10 NOTE — Patient Instructions
ELECTROPHYSIOLOGY PRE-ADMISSION INSTRUCTIONS    Patient Name: Andrew Frank  MRN#: [id number]  Date of Birth: 1930-04-10 (87 y.o.)  Today's Date: 11/10/2020    PROCEDURE:  You are scheduled for a Generator Change with Dr. Catalina Pizza. Emert.      ARRIVAL TIME:  Please report to the [facility] admitting office on the ground floor of the [facility] on: Thursday, 11/17/20    The [facility] is located at [address] [place], [place] [address]. Park in [employer] and enter through the  Main front doors to the hospital. The Heart Center is located on the right-hand side as soon as you walk in.    The EP Lab will call to notify you of your arrival time.  They will call on the business day prior to your procedure.  (If you have any questions regarding your arrival time for the Electrophysiology Lab, please call the EP Lab at [phone].)    PRE-PROCEDURE APPOINTMENTS:  7/26- completed Office visit to update history and physical (requirement within 30 days of procedure)  with Dr. Barry Dienes at [facility]   completed Pre-Admission lab work: BMP, CBC and Magnesium at the lab of your choice.         SPECIAL MEDICATION INSTRUCTIONS  Nothing to eat or drink after midnight before your procedure.  Take your prescription medications with a sip of water as instructed.  No caffeine for 24 hours prior to your procedure.     HOLD ALL over the counter vitamins or supplements on the morning of your procedure.      Warfarin Instructions    YOUR LAST INR WILL BE DUE ON 8/1. THE RESULT OF THIS INR WILL DETERMINE WHETHER OR NOT WE HOLD YOUR WARFARIN BEFORE THE PROCEDURE. PLEASE HAVE YOUR INR DRAWN AS EARLY AS POSSIBLE ON 8/1.       Additional Instructions  If you wear CPAP, please bring your mask and machine with you to the hospital.    Take a bath or shower with anti-bacterial soap the evening before, or the morning of the procedure. We will give this to you at your office visit.     Bring photo ID and your health insurance card(s).    Arrange for a driver to take you home from the hospital.    Bring an accurate list of your current medications with you to the hospital (all meds and supplements taken daily).    Wear comfortable clothes and don't bring valuables, other than photo identification card, with you to the hospital.    Please pack a bag for an overnight stay.     Please review your pre-procedure instructions and call the office at [phone] with any questions. You may ask to speak with any of Dr. Catalina Pizza. Emert's nurses. There are several of [place] in the office that can assist you. For questions regarding post procedure care or restrictions please refer to the Your Care Instructions included in the  a Generator Change Packet.     ALLERGIES  No Known Allergies    CURRENT MEDICATIONS  Outpatient Encounter Medications as of 11/10/2020   Medication Sig Dispense Refill   ? aspirin EC 81 mg tablet Take 1 tablet by mouth daily. Take with food. 90 tablet 3   ? metoprolol tartrate (LOPRESSOR) 25 mg tablet Take 1 tablet by mouth twice daily 180 tablet 2   ? nitrofurantoin monohyd/m-cryst (MACROBID) 100 mg capsule Take 100 mg  by mouth daily.     ? nitroglycerin (NITROSTAT) 0.4 mg tablet Place 1 tablet under tongue every 5 minutes as needed for Chest Pain. Max of 3 tablets, call 911. 25 tablet 1   ? tamsulosin (FLOMAX) 0.4 mg capsule Take 0.4 mg by mouth daily. Do not crush, chew or open capsules. Take 30 minutes following the same meal each day.     ? warfarin (COUMADIN) 1 mg tablet TAKE 1 TABLET BY MOUTH ONCE DAILY OR AS DIRECTED BY  CARDIOLOGY 90 tablet 0   ? warfarin (COUMADIN) 5 mg tablet Take 1 tablet by mouth once daily 90 tablet 2     No facility-administered encounter medications on file as of 11/10/2020.     _________________________________________  Form completed by: Glennon Mac, RN  Date completed: 11/10/20  Method: Via telephone and mailed to the patient.

## 2020-11-11 ENCOUNTER — Encounter: Admit: 2020-11-11 | Discharge: 2020-11-11 | Payer: MEDICARE

## 2020-11-11 ENCOUNTER — Ambulatory Visit: Admit: 2020-11-11 | Discharge: 2020-11-11 | Payer: MEDICARE

## 2020-11-11 DIAGNOSIS — I4891 Unspecified atrial fibrillation: Secondary | ICD-10-CM

## 2020-11-11 NOTE — Progress Notes
STAT     Request for the following medical records, for the purpose Continuity of Care.     Please send the following:      Most Recent ECHO Results     Please Fax to:   FAX#: [phone]  Attn: Judeen Hammans

## 2020-11-14 ENCOUNTER — Encounter: Admit: 2020-11-14 | Discharge: 2020-11-14 | Payer: MEDICARE

## 2020-11-14 DIAGNOSIS — I10 Essential (primary) hypertension: Secondary | ICD-10-CM

## 2020-11-14 DIAGNOSIS — I4891 Unspecified atrial fibrillation: Secondary | ICD-10-CM

## 2020-11-14 DIAGNOSIS — Z7901 Long term (current) use of anticoagulants: Secondary | ICD-10-CM

## 2020-11-14 LAB — PROTIME INR (PT): INR: 3.3 — ABNORMAL HIGH (ref 0.89–1.11)

## 2020-11-16 ENCOUNTER — Encounter: Admit: 2020-11-16 | Discharge: 2020-11-16 | Payer: MEDICARE

## 2020-11-16 DIAGNOSIS — I4891 Unspecified atrial fibrillation: Secondary | ICD-10-CM

## 2020-11-16 DIAGNOSIS — Z7901 Long term (current) use of anticoagulants: Secondary | ICD-10-CM

## 2020-11-16 DIAGNOSIS — I10 Essential (primary) hypertension: Secondary | ICD-10-CM

## 2020-11-16 LAB — PROTIME INR (PT): INR: 2.9

## 2020-11-17 ENCOUNTER — Encounter: Admit: 2020-11-17 | Discharge: 2020-11-17 | Payer: MEDICARE

## 2020-11-17 DIAGNOSIS — H9190 Unspecified hearing loss, unspecified ear: Secondary | ICD-10-CM

## 2020-11-17 DIAGNOSIS — K219 Gastro-esophageal reflux disease without esophagitis: Secondary | ICD-10-CM

## 2020-11-17 DIAGNOSIS — C4490 Unspecified malignant neoplasm of skin, unspecified: Secondary | ICD-10-CM

## 2020-11-17 DIAGNOSIS — H547 Unspecified visual loss: Secondary | ICD-10-CM

## 2020-11-17 DIAGNOSIS — M1990 Unspecified osteoarthritis, unspecified site: Secondary | ICD-10-CM

## 2020-11-17 DIAGNOSIS — I10 Essential (primary) hypertension: Secondary | ICD-10-CM

## 2020-11-17 MED ADMIN — MIDAZOLAM 1 MG/ML IJ SOLN [10607]: 0.5 mg | INTRAVENOUS | @ 14:00:00 | Stop: 2020-11-17 | NDC 00409230516

## 2020-11-17 MED ADMIN — FENTANYL CITRATE (PF) 50 MCG/ML IJ SOLN [3037]: 25 ug | INTRAVENOUS | @ 14:00:00 | Stop: 2020-11-17 | NDC 00409909425

## 2020-11-17 MED ADMIN — FENTANYL CITRATE (PF) 50 MCG/ML IJ SOLN [3037]: 25 ug | INTRAVENOUS | @ 13:00:00 | Stop: 2020-11-17 | NDC 00409909425

## 2020-11-17 MED ADMIN — BUPIVACAINE (PF) 0.25 % (2.5 MG/ML) IJ SOLN [87866]: 10 mL | INTRAMUSCULAR | @ 14:00:00 | Stop: 2020-11-17 | NDC 00409155918

## 2020-11-17 MED ADMIN — SODIUM CHLORIDE 0.9 % IV SOLP [27838]: 500.000 mL | @ 14:00:00 | Stop: 2020-11-17 | NDC 00338004904

## 2020-11-17 MED ADMIN — LIDOCAINE (PF) 10 MG/ML (1 %) IJ SOLN [95838]: 10 mL | INTRAMUSCULAR | @ 14:00:00 | Stop: 2020-11-17 | NDC 55150016205

## 2020-11-17 MED ADMIN — CEFAZOLIN 1 GRAM IJ SOLR [1445]: 500.000 mL | @ 14:00:00 | Stop: 2020-11-17 | NDC 00409080501

## 2020-11-17 MED ADMIN — MIDAZOLAM 1 MG/ML IJ SOLN [10607]: 0.5 mg | INTRAVENOUS | @ 13:00:00 | Stop: 2020-11-17 | NDC 00409230516

## 2020-11-17 MED ADMIN — SODIUM CHLORIDE 0.9 % IV SOLP [27838]: 1000.000 mL | INTRAVENOUS | @ 12:00:00 | Stop: 2020-11-17 | NDC 00338004904

## 2020-11-22 ENCOUNTER — Encounter: Admit: 2020-11-22 | Discharge: 2020-11-22 | Payer: MEDICARE

## 2020-11-24 ENCOUNTER — Encounter: Admit: 2020-11-24 | Discharge: 2020-11-24 | Payer: MEDICARE

## 2020-11-24 NOTE — Progress Notes
Left prepectoral incision is clean, dry, well approximated, and healing without evidence of drainage or discharge. Dermabond dry and intact. Pt reports no adverse symptoms. Some bruising is present. Incision care, including signs and symptoms of infection, reviewed(as below). Pt verbalized understanding and will remain in phone contact.    You may shower once dressing removed at follow up appointment; however avoid direct contact with the incision (allow the water to hit the back of your shoulder rather than directly on the incision).    Do not submerge incision in tub, pool, hot tub, or lake for 4 weeks.    Unless your incision is bleeding or draining, keep it open to air.    Avoid applying deodorants, powders, creams, lotions, etc. to your incision for 4 weeks.    Usually there are no stitches to be removed. Steri-strips will begin to fall off in 10-14 days. If they remain after 2 weeks, gently remove them when they are damp after a shower.    Your incision should gradually look better each day. Please notify our office immediately if you notice any of the following:   -an increase in swelling or redness   -any drainage   -increasing pain at the incision site  -fever over 100 degrees

## 2020-11-25 ENCOUNTER — Encounter: Admit: 2020-11-25 | Discharge: 2020-11-25 | Payer: MEDICARE

## 2020-11-25 DIAGNOSIS — Z7901 Long term (current) use of anticoagulants: Secondary | ICD-10-CM

## 2020-11-25 DIAGNOSIS — Z45010 Encounter for checking and testing of cardiac pacemaker pulse generator [battery]: Secondary | ICD-10-CM

## 2020-11-25 DIAGNOSIS — I495 Sick sinus syndrome: Secondary | ICD-10-CM

## 2020-12-02 ENCOUNTER — Encounter: Admit: 2020-12-02 | Discharge: 2020-12-02 | Payer: MEDICARE

## 2020-12-02 DIAGNOSIS — I495 Sick sinus syndrome: Secondary | ICD-10-CM

## 2020-12-02 DIAGNOSIS — Z950 Presence of cardiac pacemaker: Secondary | ICD-10-CM

## 2020-12-06 ENCOUNTER — Encounter: Admit: 2020-12-06 | Discharge: 2020-12-06 | Payer: MEDICARE

## 2020-12-06 DIAGNOSIS — Z7901 Long term (current) use of anticoagulants: Secondary | ICD-10-CM

## 2020-12-06 DIAGNOSIS — Z45010 Encounter for checking and testing of cardiac pacemaker pulse generator [battery]: Secondary | ICD-10-CM

## 2020-12-06 DIAGNOSIS — I495 Sick sinus syndrome: Secondary | ICD-10-CM

## 2020-12-06 LAB — PROTIME INR (PT): INR: 2.7

## 2020-12-13 ENCOUNTER — Encounter: Admit: 2020-12-13 | Discharge: 2020-12-13 | Payer: MEDICARE

## 2020-12-13 DIAGNOSIS — I495 Sick sinus syndrome: Secondary | ICD-10-CM

## 2020-12-13 DIAGNOSIS — Z7901 Long term (current) use of anticoagulants: Secondary | ICD-10-CM

## 2020-12-13 DIAGNOSIS — Z45010 Encounter for checking and testing of cardiac pacemaker pulse generator [battery]: Secondary | ICD-10-CM

## 2020-12-13 LAB — PROTIME INR (PT): INR: 2.7

## 2020-12-20 ENCOUNTER — Encounter: Admit: 2020-12-20 | Discharge: 2020-12-20 | Payer: MEDICARE

## 2021-01-13 ENCOUNTER — Encounter: Admit: 2021-01-13 | Discharge: 2021-01-13 | Payer: MEDICARE

## 2021-01-13 DIAGNOSIS — I10 Essential (primary) hypertension: Secondary | ICD-10-CM

## 2021-01-13 DIAGNOSIS — Z7901 Long term (current) use of anticoagulants: Secondary | ICD-10-CM

## 2021-01-13 DIAGNOSIS — I4891 Unspecified atrial fibrillation: Secondary | ICD-10-CM

## 2021-01-13 LAB — PROTIME INR (PT): INR: 2.9

## 2021-02-08 ENCOUNTER — Encounter: Admit: 2021-02-08 | Discharge: 2021-02-08 | Payer: MEDICARE

## 2021-02-08 MED ORDER — WARFARIN 5 MG PO TAB
ORAL_TABLET | Freq: Every day | ORAL | 3 refills | 90.00000 days | Status: AC
Start: 2021-02-08 — End: ?

## 2021-02-10 ENCOUNTER — Encounter: Admit: 2021-02-10 | Discharge: 2021-02-10 | Payer: MEDICARE

## 2021-02-10 DIAGNOSIS — I4891 Unspecified atrial fibrillation: Secondary | ICD-10-CM

## 2021-02-10 DIAGNOSIS — Z7901 Long term (current) use of anticoagulants: Secondary | ICD-10-CM

## 2021-02-10 DIAGNOSIS — I10 Essential (primary) hypertension: Secondary | ICD-10-CM

## 2021-02-10 LAB — PROTIME INR (PT): INR: 2.9

## 2021-02-17 ENCOUNTER — Encounter: Admit: 2021-02-17 | Discharge: 2021-02-17 | Payer: MEDICARE

## 2021-02-28 ENCOUNTER — Encounter: Admit: 2021-02-28 | Discharge: 2021-02-28 | Payer: MEDICARE

## 2021-02-28 DIAGNOSIS — I4891 Unspecified atrial fibrillation: Secondary | ICD-10-CM

## 2021-02-28 DIAGNOSIS — Z7901 Long term (current) use of anticoagulants: Secondary | ICD-10-CM

## 2021-02-28 DIAGNOSIS — K219 Gastro-esophageal reflux disease without esophagitis: Secondary | ICD-10-CM

## 2021-02-28 DIAGNOSIS — Z950 Presence of cardiac pacemaker: Secondary | ICD-10-CM

## 2021-02-28 DIAGNOSIS — I10 Essential (primary) hypertension: Secondary | ICD-10-CM

## 2021-02-28 DIAGNOSIS — H547 Unspecified visual loss: Secondary | ICD-10-CM

## 2021-02-28 DIAGNOSIS — H9190 Unspecified hearing loss, unspecified ear: Secondary | ICD-10-CM

## 2021-02-28 DIAGNOSIS — C4490 Unspecified malignant neoplasm of skin, unspecified: Secondary | ICD-10-CM

## 2021-02-28 DIAGNOSIS — M1990 Unspecified osteoarthritis, unspecified site: Secondary | ICD-10-CM

## 2021-02-28 DIAGNOSIS — R079 Chest pain, unspecified: Secondary | ICD-10-CM

## 2021-02-28 NOTE — Telephone Encounter
Reviewed chest x-ray with TLR. TLR states that the device has not moved and is intact.       Results called to patient's wife. They have no further questions this time.

## 2021-02-28 NOTE — Patient Instructions
Thank you for visiting our office today.    We would like to make the following medication adjustments:  NONE      Otherwise continue the same medications as you have been doing.          We will be pursuing the following tests after your appointment today:       Orders Placed This Encounter    CHEST 2 VIEWS         Please call us with any questions or concerns.        Please allow 5-7 business days for our providers to review your results. All normal results will go to MyChart. If you do not have Mychart, it is strongly recommended to get this so you can easily view all your results. If you do not have mychart, we will attempt to call you once with normal lab and testing results. If we cannot reach you by phone with normal results, we will send you a letter.  If you have not heard the results of your testing after one week please give [place] a call.       Your Cardiovascular Medicine [place] Team Richardson Landry, Rene Kocher, Threasa Beards, and [facility])  phone number is [phone].

## 2021-03-14 ENCOUNTER — Encounter: Admit: 2021-03-14 | Discharge: 2021-03-14 | Payer: MEDICARE

## 2021-03-14 DIAGNOSIS — I10 Essential (primary) hypertension: Secondary | ICD-10-CM

## 2021-03-14 DIAGNOSIS — I4891 Unspecified atrial fibrillation: Secondary | ICD-10-CM

## 2021-03-14 DIAGNOSIS — Z7901 Long term (current) use of anticoagulants: Secondary | ICD-10-CM

## 2021-03-14 LAB — PROTIME INR (PT): INR: 2 — ABNORMAL HIGH (ref 0.89–1.11)

## 2021-03-20 ENCOUNTER — Ambulatory Visit: Admit: 2021-03-20 | Discharge: 2021-03-20 | Payer: MEDICARE

## 2021-03-20 ENCOUNTER — Encounter: Admit: 2021-03-20 | Discharge: 2021-03-20 | Payer: MEDICARE

## 2021-03-20 DIAGNOSIS — K219 Gastro-esophageal reflux disease without esophagitis: Secondary | ICD-10-CM

## 2021-03-20 DIAGNOSIS — H547 Unspecified visual loss: Secondary | ICD-10-CM

## 2021-03-20 DIAGNOSIS — I10 Essential (primary) hypertension: Secondary | ICD-10-CM

## 2021-03-20 DIAGNOSIS — R0989 Other specified symptoms and signs involving the circulatory and respiratory systems: Secondary | ICD-10-CM

## 2021-03-20 DIAGNOSIS — M1990 Unspecified osteoarthritis, unspecified site: Secondary | ICD-10-CM

## 2021-03-20 DIAGNOSIS — H9190 Unspecified hearing loss, unspecified ear: Secondary | ICD-10-CM

## 2021-03-20 DIAGNOSIS — Z950 Presence of cardiac pacemaker: Secondary | ICD-10-CM

## 2021-03-20 DIAGNOSIS — C4490 Unspecified malignant neoplasm of skin, unspecified: Secondary | ICD-10-CM

## 2021-03-20 MED ORDER — METOPROLOL SUCCINATE 25 MG PO TB24
12.5 mg | ORAL_TABLET | Freq: Every day | ORAL | 3 refills | 90.00000 days | Status: AC
Start: 2021-03-20 — End: ?

## 2021-03-20 NOTE — Patient Instructions
Please schedule an appointment with Dr. Aris Georgia in 6 months.  To schedule an appointment call [phone].     Start Toprol XL 12.5mg  (half tablet) once daily.     -- Since you are on anticoagulation, monitor for any signs or symptoms of bleeding, including blood in the stool or urine, etc and to contact your Primary Care Physician if any occurs.    In order to provide you the best care possible we ask that you follow up as below:    For non-urgent questions please contact us through your MyChart account.   For all medication refills please contact your pharmacy or send a request through [place].     For all questions that may need to be addressed urgently please call the nursing triage voicemail at [phone] Monday - Friday 8-5 only. Please leave a detailed message with your name, date of birth, and reason for your call.      Please allow ~ 10 business days for the results of any testing to be reviewed. Please call our office if you have not heard from a nurse within this time frame.

## 2021-03-20 NOTE — Progress Notes
Date of Service: 03/20/2021    Andrew Frank is a 87 y.o. male.       HPI  I had the pleasure of seeing your patient Andrew Frank in the [facility] at our [facility] office today for Electrophysiolgy Consultation regarding his unusual mobility of pacemaker generator s/p generator change on 11/17/2020.    He is typically followed and was referred by my partner Dr. Geronimo Boot, his primary cardiologist.  Andrew Frank is an exceptionally pleasant 87 y.o. male, who is accompanied by his equally pleasant spouse, Andrew Frank.    **All data in this note, including the past medical history, was newly collected today.    His PMHx briefly includes:  Sinus Node Dysfunction; Medtronic Dual Chamber Permanent Pacemaker--> S/P Generator Change by Dr. Naoma Diener (11/17/2020); Paroxysmal Atrial Fibrillation; Essential Hypertension; Chronic Anticoagulation; GERD; Skin Cancer      DETAILED UPDATED PMHx:    -- 11/11/2020:  Echocardiogram:  The left ventricular size is normal. The left ventricular wall thickness is normal. Normal geometry. The left ventricular systolic function is normal. There are no segmental wall motion abnormalities. Right Ventricle: The right ventricular size, wall thickness and systolic function are normal. Left Atrium: Moderately dilated. Right Atrium: Moderately dilated. Mitral Valve: Normal valve structure. No stenosis. Mild regurgitation. The ascending aorta is mildly dilated. No pericardial effusion. LA size moderately dilated.  -- 11/17/2020:   Medtronic DDDR Pacemaker Generator Change by Dr. Naoma Diener 11/17/2020--patient was in atrial fibrillation atrial flutter at the time of generator change.  When the new generator was connected he was in atrial flutter which we successfully overdrive paced terminated and turned on atrial antitachycardia pacing.  -- 02/28/2021:  OV (Dr. Geronimo Boot):  He notes extreme mobility of his PPM into his axilla and down into his chest.  Apparently this has been problematic for him in the past.  I am going to do an x-ray and ask Dr. Naoma Diener to follow up with him about the placement of the generator.       He STATES his permanent pacemaker does not move around to his knowledge.  He states it is in the same position it has been.  He does state that he had problems with it early on but not since his generator change.    He does not recall being on Metoprolol in the past.  He does not recall why he might have discontinued it.  His wife does not either.    He denies any chest discomfort, shortness of breath, palpitations, lightheadedness, near syncope or syncope, PND or orthopnea.    FHx, SHx and ROS documented and I have reviewed, with some pertinent features to include:  No FHx of premature CAD. He is a Non-Smoker. Most pertinent ROS is included/discussed throughout the note, e.g. HPI and A/P.      ASSESSMENT AND PLAN:    -- Concern for Unusual Mobility of PPM Device  -- Medtronic Dual Chamber Permanent Pacemaker--> S/P Generator Change by Dr. Naoma Diener (11/17/2020)  -- Sinus Node Dysfunction  -- Paroxysmal Atrial Fibrillation  -- Essential Hypertension  -- Chronic Anticoagulation  -- GERD  -- Skin Cancer      Andrew Frank underwent dual-chamber pacemaker generator change on 11/17/2020.  He follows with Dr. Geronimo Boot and during an OV with him on November 15th, Dr. Geronimo Boot was concerned about extreme mobility of his pacemaker generator at times down into his axilla and down into his chest.     He planned to obtain a chest x-ray.  Unfortunately that was not completed.    He referred him for my evaluation.    At this time his generator does not appear to move inappropriately.  His device appears very stable in its location. It does not appear to have significant movement with my manipulation.    His device today is functioning well.    He has had some very brief episodes of recurrent atrial tachycardia and atrial flutter.  The longest has only been a minute and half in duration.  He is on warfarin.  We will add a low-dose beta-blocker to his medical regiment for improved ventricular rate control.   We will add Toprol XL 12.5 mg daily.  Review of his medication list shows that he was on Toprol before. I cannot tell why it was discontinued.  But it was done so recently.    He denies any symptoms of lightheadedness as a potential reason for discontinuing it.    We will avoid antiarrhythmic drug therapy at least at this time.  If the episodes start becoming longer in duration then I would pursue antiarrhythmic drug therapy with amiodarone.    Blood pressure is under good control at this time.    See additional plan details below.      PLAN:    -- He will initiate Toprol XL 12.5 mg daily.  -- Since he is on anticoagulation, I have asked him to monitor for any signs or symptoms of bleeding, including blood in the stool or urine, etc and to contact his PMD if any occurs.      Total Time Today was 43 minutes in the following activities: Preparing to see the patient, Obtaining and/or reviewing separately obtained history, Performing a medically appropriate examination and/or evaluation, Counseling and educating the patient/family/caregiver, Ordering medications, tests, or procedures, Referring and communication with other health care professionals (when not separately reported) and Documenting clinical information in the electronic or other health record       Andrew Frank was educated regarding plan of care. He was instructed to call our office with any questions or concerns, as well as to notify us of any new or worsening symptoms. He verbalized understanding.   I appreciate the opportunity to participate in the care of your patient.  Please do not hesitate to contact me directly if you have any questions or further insights into his care.  I have scheduled his follow-up with Dr. Geronimo Boot in 6 months.         Vitals:    03/20/21 1018   PainSc: Zero   Weight: 83 kg (182 lb 15.4 oz)   Height: 172.7 cm (5' 8)     Body mass index is 27.82 kg/m?[name]     Past Medical History  Patient Active Problem List    Diagnosis Date Noted   ? Left shoulder pain 07/11/2016   ? Chronic anticoagulation 04/01/2014   ? Long term (current) use of anticoagulants 04/01/2014   ? 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 Atrial fibrillation (HCC) 03/31/2014   ? Sinus node dysfunction (HCC) 03/31/2014     Medtronic PM      ? Degenerative arthritis 03/31/2014   ? GERD (gastroesophageal reflux disease) 03/31/2014   ? Hearing impairment 03/31/2014   ? Hypertension 03/31/2014   ? Skin cancer 03/31/2014   ? Visual impairment 03/31/2014         Review of Systems   Constitutional: Negative.   HENT: Negative.    Eyes: Negative.    Cardiovascular: Negative.  Respiratory: Negative.    Endocrine: Negative.    Hematologic/Lymphatic: Negative.    Skin: Negative.    Musculoskeletal: Negative.    Gastrointestinal: Negative.    Genitourinary: Negative.    Neurological: Negative.    Psychiatric/Behavioral: Negative.    Allergic/Immunologic: Negative.        Physical Exam   Constitutional:  He is in no acute distress, resting comfortably.  Skin/Integument:  Warm and dry.  Eyes:  PERRL, sclera are non-icteric and no xanthelasmas noted.  ENT:  Hearing is intact.  Heme/Lym/Immun:  Supple neck, without thyromegaly.  Respiratory-Pulmonary/Chest:  Effort normal and breath sounds normal. No respiratory distress or accessory muscle use. No obvious tracheal deviation.  Clear to auscultation bilaterally.    Cardiovascular:  No evidence of increased jugular venous pressure, carotids are 2+/4+ equal bilaterally. Regular rhythm, S1, S2.  I do not appreciate any significant murmur today No heaves, thrills or rubs.  Musc/Skeletal-Extremities:  Without significant peripheral edema. With what appears to be full ROM.  Device Site:  Is in his left infraclavicular region and well-healed. Device appears very stable in location. It does appear to have significant movement with my manipulation.  Neuro:  Patient is alert and oriented to person, place, and time.   Psych:  Patient does not appear anxious, he appears appropriate, with normal non-pressured speech and what appears to be appropriate judgement        Cardiovascular Studies  Full Device Check performed with reprogramming which I have extensively reviewed. Changes, if done, as discussed below and is detailed in other dictation/note.  89% atrial pacing; 6.05% ventricular pacing;  Since his pacemaker generator change in antitachycardia pacing he has had 10 episodes of AT/AF 6 successfully treated and 65 episodes of VT.  Device is programmed to detect any arrhythmia and they are in the atrium or ventricle at 150 bpm or more.  Last episode was on 10/29 of nonsustained VT.  Last SVT was October 30 lasting 18 seconds in duration.  That episode did not show sudden onset or termination as best we can tell.  However an episode on 10/29 clearly shows that he is in sinus rhythm followed by ectopy and a burst of A. tach with predominantly 1: 1 conduction in the 350 ms range followed by sinus tachycardia in the 490 to 500 ms range.  Other episodes similarly have a sudden onset and termination.  The longest of these episodes appears to be a minute and 20 seconds.  Few of these episodes appear to be atrial flutter.      Last results for 12-Lead ECG this visit   ECG 12-LEAD    Collection Time: 03/20/21 10:27 AM   Result Value Status    VENTRICULAR RATE 87 Final    P-R INTERVAL  Final    QRS DURATION 84 Final    Q-T INTERVAL 364 Final    QTC CALCULATION (BAZETT) 438 Final    P AXIS  Final    R AXIS 0 Final    T AXIS 38 Final    Impression    Atrial-paced rhythm with premature ventricular or aberrantly conducted complexes  Abnormal ECG  No previous ECGs available in MUSE  Confirmed by Holland Commons ([id number]) on 03/20/2021 10:39:45 AM            Cardiovascular Health Factors  Vitals BP Readings from Last 3 Encounters:   02/28/21 130/72   11/17/20 107/66   11/11/20 110/62     Wt Readings from Last 3 Encounters:  03/20/21 83 kg (182 lb 15.4 oz)   02/28/21 85.4 kg (188 lb 3.2 oz)   11/17/20 83.6 kg (184 lb 4.9 oz)     BMI Readings from Last 3 Encounters:   03/20/21 27.82 kg/m?   02/28/21 28.62 kg/m?   11/17/20 28.02 kg/m?      Smoking Social History     Tobacco Use   Smoking Status Never   Smokeless Tobacco Never      Lipid Profile Cholesterol   Date Value Ref Range Status   11/16/2019 176  Final     HDL   Date Value Ref Range Status   11/16/2019 50  Final     LDL   Date Value Ref Range Status   11/16/2019 114 (H) <100 Final     Triglycerides   Date Value Ref Range Status   11/16/2019 62  Final      Blood Sugar No results found for: HGBA1C  Glucose   Date Value Ref Range Status   11/08/2020 118 (H) 70 - 105 Final   09/30/2020 147 (H) 70 - 105 Final   12/23/2019 119 (H) 70 - 105 Final          Problems Addressed Today  Encounter Diagnoses   Name Primary?   ? Cardiovascular symptoms Yes       Current Medications (including today's revisions)  ? aspirin EC 81 mg tablet Take 1 tablet by mouth daily. Take with food.   ? nitrofurantoin monohyd/m-cryst (MACROBID) 100 mg capsule Take 100 mg by mouth daily.   ? nitroglycerin (NITROSTAT) 0.4 mg tablet Place 1 tablet under tongue every 5 minutes as needed for Chest Pain. Max of 3 tablets, call 911.   ? tamsulosin (FLOMAX) 0.4 mg capsule Take 0.4 mg by mouth daily. Do not crush, chew or open capsules. Take 30 minutes following the same meal each day.   ? warfarin (COUMADIN) 1 mg tablet TAKE 1 TABLET BY MOUTH ONCE DAILY OR AS DIRECTED BY  CARDIOLOGY   ? warfarin (COUMADIN) 5 mg tablet Take 1 tablet by mouth once daily           Documentation recorded by Holland Falling, acting as scribe for [employer].D.

## 2021-03-27 ENCOUNTER — Encounter: Admit: 2021-03-27 | Discharge: 2021-03-27 | Payer: MEDICARE

## 2021-03-27 ENCOUNTER — Inpatient Hospital Stay: Admit: 2021-03-27 | Discharge: 2021-03-27 | Payer: MEDICARE

## 2021-03-27 ENCOUNTER — Inpatient Hospital Stay: Admit: 2021-03-27 | Payer: MEDICARE

## 2021-03-27 DIAGNOSIS — S32810A Multiple fractures of pelvis with stable disruption of pelvic ring, initial encounter for closed fracture: Secondary | ICD-10-CM

## 2021-03-27 LAB — COMPREHENSIVE METABOLIC PANEL
ALBUMIN: 3.5 g/dL (ref 3.5–5.0)
ALK PHOSPHATASE: 84 U/L (ref 25–110)
ALT: 8 U/L (ref 7–56)
ANION GAP: 10 (ref 3–12)
AST: 20 U/L (ref 7–40)
CO2: 21 MMOL/L (ref 21–30)
EGFR: >60 mL/min (ref >60)
SODIUM: 133 MMOL/L — ABNORMAL LOW (ref 137–147)
TOTAL BILIRUBIN: 1.6 mg/dL — ABNORMAL HIGH (ref 0.3–1.2)

## 2021-03-27 LAB — PHOSPHORUS: PHOSPHORUS: 3 mg/dL — ABNORMAL LOW (ref >60)

## 2021-03-27 LAB — IONIZED CALCIUM: IONIZED CALCIUM: 1 MMOL/L (ref 1.0–1.3)

## 2021-03-27 LAB — PROTIME INR (PT)
INR: 2.9 mg/dL — ABNORMAL HIGH (ref 0.8–1.2)
PROTIME: 33 s — ABNORMAL HIGH (ref 9.5–14.2)
PROTIME: 16 s — ABNORMAL HIGH (ref 9.5–14.2)

## 2021-03-27 LAB — CBC: WBC COUNT: 10 K/UL — ABNORMAL LOW (ref 4.5–11.0)

## 2021-03-27 LAB — PTT (APTT)
PTT: 45 s — ABNORMAL HIGH (ref 24.0–36.5)
PTT: 36 s — ABNORMAL HIGH (ref 24.0–36.5)

## 2021-03-27 LAB — LACTIC ACID(LACTATE): LACTIC ACID: 1.5 MMOL/L (ref 0.5–2.0)

## 2021-03-27 LAB — FIBRINOGEN: FIBRINOGEN: 291 mg/dL (ref 200–400)

## 2021-03-27 LAB — MAGNESIUM: MAGNESIUM: 2.1 mg/dL — ABNORMAL LOW (ref 1.6–2.6)

## 2021-03-27 MED ORDER — MIDAZOLAM 1 MG/ML IJ SOLN
1 mg | Freq: Once | INTRAVENOUS | 0 refills | Status: CN
Start: 2021-03-27 — End: ?

## 2021-03-27 MED ORDER — FLUMAZENIL 0.1 MG/ML IV SOLN
.2 mg | INTRAVENOUS | 0 refills | Status: CN | PRN
Start: 2021-03-27 — End: ?

## 2021-03-27 MED ORDER — POLYETHYLENE GLYCOL 3350 17 GRAM PO PWPK
17 g | Freq: Every day | ORAL | 0 refills | Status: AC
Start: 2021-03-27 — End: ?
  Administered 2021-03-27 – 2021-04-01 (×3): 17 g via ORAL

## 2021-03-27 MED ORDER — PHYTONADIONE IVPB
10 mg | Freq: Once | INTRAVENOUS | 0 refills | Status: CP
Start: 2021-03-27 — End: ?
  Administered 2021-03-27 (×2): 10 mg via INTRAVENOUS

## 2021-03-27 MED ORDER — MIDAZOLAM 1 MG/ML IJ SOLN
0 refills | Status: CP
Start: 2021-03-27 — End: ?
  Administered 2021-03-27: 21:00:00 1 mg via INTRAVENOUS
  Administered 2021-03-27 (×2): .5 mg via INTRAVENOUS

## 2021-03-27 MED ORDER — SENNOSIDES-DOCUSATE SODIUM 8.6-50 MG PO TAB
1 | Freq: Two times a day (BID) | ORAL | 0 refills | Status: AC
Start: 2021-03-27 — End: ?
  Administered 2021-03-27 – 2021-04-03 (×10): 1 via ORAL

## 2021-03-27 MED ORDER — NALOXONE 0.4 MG/ML IJ SOLN
.08 mg | INTRAVENOUS | 0 refills | Status: CN | PRN
Start: 2021-03-27 — End: ?

## 2021-03-27 MED ORDER — IOPAMIDOL 300 MG IODINE /ML (61 %) IV SOLN
75 mL | Freq: Once | INTRA_ARTERIAL | 0 refills | Status: CP
Start: 2021-03-27 — End: ?
  Administered 2021-03-27: 22:00:00 75 mL via INTRA_ARTERIAL

## 2021-03-27 MED ORDER — SODIUM CHLORIDE 0.9 % IJ SOLN
50 mL | Freq: Once | INTRAVENOUS | 0 refills | Status: CP
Start: 2021-03-27 — End: ?
  Administered 2021-03-27: 19:00:00 50 mL via INTRAVENOUS

## 2021-03-27 MED ORDER — HUM PROTHROMBIN CPLX(PCC)4FACT 500 UNIT (400-620 UNIT) IV SOLR
1070 [IU] | Freq: Once | INTRAVENOUS | 0 refills | Status: CP
Start: 2021-03-27 — End: ?
  Administered 2021-03-27: 19:00:00 1070 [IU] via INTRAVENOUS

## 2021-03-27 MED ORDER — IOHEXOL 350 MG IODINE/ML IV SOLN
100 mL | Freq: Once | INTRAVENOUS | 0 refills | Status: CP
Start: 2021-03-27 — End: ?
  Administered 2021-03-27: 19:00:00 100 mL via INTRAVENOUS

## 2021-03-27 MED ORDER — METOPROLOL TARTRATE 5 MG/5 ML IV SOLN
2.5 mg | INTRAVENOUS | 0 refills | Status: AC
Start: 2021-03-27 — End: ?
  Administered 2021-03-27 – 2021-03-28 (×3): 2.5 mg via INTRAVENOUS

## 2021-03-27 MED ORDER — FENTANYL CITRATE (PF) 50 MCG/ML IJ SOLN
0 refills | Status: CP
Start: 2021-03-27 — End: ?
  Administered 2021-03-27: 21:00:00 50 ug via INTRAVENOUS
  Administered 2021-03-27 (×2): 25 ug via INTRAVENOUS

## 2021-03-27 MED ORDER — FENTANYL CITRATE (PF) 50 MCG/ML IJ SOLN
25-50 ug | INTRAVENOUS | 0 refills | Status: AC | PRN
Start: 2021-03-27 — End: ?
  Administered 2021-03-27: 18:00:00 25 ug via INTRAVENOUS
  Administered 2021-03-28: 06:00:00 50 ug via INTRAVENOUS

## 2021-03-27 NOTE — Progress Notes
Patient taken to CT scan via bed on monitor with Resource RN and tech.  Patient tolerated travel well.  Returned to room and placed back on bedside monitor.

## 2021-03-27 NOTE — H&P (View-Only)
Critical Care Progress Note          Today's Date:  03/27/2021  Name:  Andrew Frank                       MRN:  [id number]   Admission Date: 03/27/2021  LOS: 0 days                       HPI: Andrew Frank is a 87 y.o. male with history of CAD on ASA, Afib on warfarin admitted as a trauma transfer s/p fall from standing on left hip without any loss of consciousness.  He localizes pain to his left arm, left hip. Imaging demonstrated pelvic fracture with 9x46cm pelvic hematoma. He received Vitamin K prior to transfer with an INR of 2.9.    PMHx: Afib, CAD  PSHx:  Right inguinal hernia repair, appendectomy, pacemaker placement  Med:  Metoprolol 12.5mg  qday, tamsulosin 0.4mg  oral daily, warfarin 2.5mg  day, ASA 81mg  daily  SH:  Nondrinker, nonsmoker  NKDA  Famiy history:  No hx of bleeding or clotting disorders    Assessment/Plan:   Principal Problem:    Pelvic ring fracture, closed, initial encounter (HCC)    Neuro   Post-traumatic pain   Iv fentanyl prn    CT cervical spine to evaluate for completion   Maintain aspen collar    CV   Afib  On warfarin 2.5mg  qday  -Hold PTA warfarin.  INR 2.9.  Given Vitamin K prior to transfer.  Kcentra ordered.  -Pacemaker interrogation completed 12/5 with normal device function  -On PTA metoprolol (holding), IV metoprolol continued  -Holding PTA ASA    Pulm  No active pulmonary problems  CT chest for completion scans for trauma workup    GI/FEN  NPO diet  Bowel regimen as able  Zofran prn for nausea  Monitor and replenish electrolytes as needed    Hyponatremia  133    Left pubic rami fractures with pelvic hematoma  Repeat CTA abd/pelvis to evaluate  INR of 2.9    GU    No active GU problems  Foley catheter in place, maintain    Heme/ID  Acute blood loss anemia   Hgb 13.4    Thrombocytopenia  140    No leukocytosis    Endo  No active endocrine problems    MSK  Impaired Mobility and ADLs  - PT/OT evaluation    Chronic L1 compression fx    Xray for left elbow and forearm given pain    PPx  SCDs, holding dvt ppx given pelvic hematoma    Dispo - ICU    SW/CM following for discharge planning needs.     Patient discussed with staff during rounds.     __________________________________________________________________________________  Subjective:  Admitted to the unit with pelvic fractures and associated hematoma.  __________________________________________________________________________________  Objective:  Medications:  Scheduled Meds:metoprolol (LOPRESSOR) injection 2.5 mg, 2.5 mg, Intravenous, Q6H*  polyethylene glycol 3350 (MIRALAX) packet 17 g, 17 g, Oral, QDAY  senna/docusate (SENOKOT-S) tablet 1 tablet, 1 tablet, Oral, BID    Continuous Infusions:  PRN and Respiratory Meds:fentaNYL citrate PF Q1H PRN                       Vital Signs: Last Filed                  Vital Signs: 24 Hour Range   BP: 113/77 (12/12 1016)  Temp: 36.7 ?C (98 ?F) (12/12 1016)  Pulse: 88 (12/12 1016)  Respirations: 15 PER MINUTE (12/12 1016)  SpO2: 98 % (12/12 1016)  O2 Device: None (Room air) (12/12 1016)  Height: 175.3 cm (5' 9) (12/12 1016)  Weight: 82.6 kg (182 lb) (12/12 1016)  BP: (113)/(77)   Temp:  [36.7 ?C (98 ?F)]   Pulse:  [88]   Respirations:  [15 PER MINUTE]   SpO2:  [98 %]   O2 Device: None (Room air)    Intensity Pain Scale (Self Report): (not recorded) Vitals:    03/27/21 1016   Weight: 82.6 kg (182 lb)         Intake/Output Summary:  (Last 24 hours)  No intake or output data in the 24 hours ending 03/27/21 1101      Body mass index is 26.88 kg/m?[name]  BMI Category: Overweight (25 to <30)    Anemia: Yes - acute blood loss  Renal Function: Normal  Acidosis:Normal  Electrolyte Abnormalities:    Sodium: Hyponatremia present (Na<136 mmol/L)   Potassium: Normal   Magnesium:Normal   Phosphorous:Normal  Calcium:Normal    Lab Results   Component Value Date/Time    HGB 13.4 (L) 03/27/2021 10:35 AM    HCT 39.0 (L) 03/27/2021 10:35 AM    WBC 10.8 03/27/2021 10:35 AM    PLTCT 140 (L) 03/27/2021 10:35 AM    INR 2.0 (H) 03/14/2021 12:00 AM    INR 2.4 09/30/2020 12:00 AM     Lab Results   Component Value Date/Time    NA 135 (L) 11/08/2020 12:00 AM    K 4.0 11/08/2020 12:00 AM    CL 103 11/08/2020 12:00 AM    CO2 23.0 11/08/2020 12:00 AM    BUN 10.0 11/08/2020 12:00 AM    CR 1.03 11/08/2020 12:00 AM    MG 2.4 11/08/2020 12:00 AM    CA 8.8 11/08/2020 12:00 AM     No results found for: GLUPOC     PE:   Primary survey:   Airway patent to voice, trachea midline   Breath sounds equal bilaterally, equal chest rise   Carotid and femoral pulses palpable bilaterally, heart sounds clear  GCS 15([id number])  5/5 strength/sensation in bilateral upper and lower extremities     Secondary survey:   HEENT: Pupils 3 mm and reactive bilaterally, no skull/maxillofacial bony deformities or TTP, no scalp lacs/abrasions, no otorrhea/rhinorrhea, bilateral tympanic membranes intact  CHEST: no clavicular, sternal or thoracic TTP/bony deformities, bilateral axillae clear   ABD: s/nt/nd   BACK: no C/T/S spine TTP or step-off deformities, mild ttp at lumbar spine, bilateral flanks clear  PELVIS: no obvious instability, perineum clear  EXT: no obvious long bone deformities, abrasions or lacs to bilateral upper and lower extremities except for left forearm ttp and skin tears at left elbow, palpable radial, dorsalis pedis, and posterior tibials arteries bilaterally    Ventilator / Respiratory Therapy:  No   Ventilator weaning trial:  Not applicable    Antibiotic Usage:  No    Lines/Drains/Airways  Lines, Drains, Airways and Wounds     IV  Duration           Peripheral IV 03/27/21 1053 Right Forearm 20 G <1 day    Peripheral IV 03/27/21 1056 Right Wrist 22 G <1 day          Wound  Duration           Wounds 03/27/21 1052 Skin tear Left;Posterior Elbow <1 day  Wounds 03/27/21 1053 Pressure injury Right;Left Buttocks <1 day                Malnutrition Details:                                                  Wounds:      Wounds 03/27/21 1052 Skin tear Left;Posterior Elbow (Active)   03/27/21 1052 Elbow   Wound Type: Skin tear   Pressure Injury Stages (For Pressure Injury Wound Type Only):    Pressure Injury Present On Inpatient Admission:    If this pressure injury is suspected to be device related, please select the device::    Wound/Pressure Injury Orientation: Left;Posterior   Wound Location Comments:    ZXWound Location:    Wound Description (Comments):    Wound Type::    Number of days: 0       Wounds 03/27/21 1053 Pressure injury Right;Left Buttocks (Active)   03/27/21 1053 Buttocks   Wound Type: Pressure injury   Pressure Injury Stages (For Pressure Injury Wound Type Only): Stage 1   Pressure Injury Present On Inpatient Admission: Y   If this pressure injury is suspected to be device related, please select the device::    Wound/Pressure Injury Orientation: Right;Left   Wound Location Comments:    ZXWound Location:    Wound Description (Comments):    Wound Type::    Number of days: 0             Eston Esters, MD  Pager [phone]

## 2021-03-27 NOTE — Progress Notes
THERAPY  NOTE      Name: Andrew BACHA Sr.        MRN: [id number]          DOB: [birth year]            Age: 87 y.o.  Admission Date: 03/27/2021       LOS: 0 days    Date of Service: 03/27/2021      Orders received and chart reviewed.  Patient is a 88 y/o male with h/o CAD, s/p fall from standing. Now with pelvic fracture and 9x46cm pelvic hematoma.     Patient on bedrest at this time and workup still in progress.  PT/OT will f/u 12/13.    Therapist: Delsa Sale, PT, DPT, MHAM  Date: 03/27/2021

## 2021-03-27 NOTE — Progress Notes
Pt at [facility] getting up out of chair, no loc.     Imaging:   CT Pelvis: Pelvic fx with hematoma,   Left sup and inf pubic rami fx with pelvic hematoma 9 x 4.6 with mass effect. Non displaced left sacral fx  CT head and lumbar neg    Labs  INR 2.9  Pt 34  Ptt 46.4  H/H 13.7/41.2  Plt 181    Vitals  96ra 116/69, 18rr, afebrile, 84hr    Meds  Fent    SW:FUXN, on coumadin,  gerd, freq falls

## 2021-03-27 NOTE — Consults
Interventional Radiology Consult Note with Pre-procedural History and Physical      Admission Date: 03/27/2021  LOS: 0 days                       Principal Problem:    Pelvic ring fracture, closed, initial encounter [facility])      Reason for consult: Pelvic bleed    Assessment:   - Admitted with Pelvic fracture  - Review of imaging significant for active extravasation  - Labs, medications, and allergies meet procedural protocol.   - Pt states he is unable to lay flat d/t comfort, reiterated we will provide conscious sedation for procedure. He was able to lay flat for CT without any medications  - Pt is on a therapeutic blood thinner-warfarin PTA. Pt's INR was 2.9, received Vit K and receiving   -   Platelet Count   Date Value Ref Range Status   03/27/2021 140 (L) 150 - 400 K/UL Final   ;   INR Home   Date Value Ref Range Status   09/30/2020 2.4  Final     INR   Date Value Ref Range Status   03/27/2021 2.9 (H) 0.8 - 1.2 Final       Plan:  - This case has been reviewed and added onto the IR schedule for today.   - For sedation purposes, please keep NPO prior to procedure. May take PO medications with sips of water.    We appreciate being able to participate in this patient's care. Please page with any questions or concerns.    Almira Coaster, APRN-NP   Pgr [phone]    IR Team Pager [phone] (After-hours and Weekends)  __________________________________________________________________      Procedure: Pelvic arteriogram with intervention    IR Pre Procedure Notes:  Consent in IR pre/post  __________________________________________________________________    Chief Complaint:  Pelvic bleed    Previous Anesthetic/Sedation History:  Reviewed.    Code Status: Full Code    History of present illness:  Andrew THIESSE Sr. is a 87 y.o. male patient with hx as above. IR consulted for pelvic arteriogram. See ROS below for current symptoms    Review of Systems  Constitutional: negative  Respiratory: negative  Gastrointestinal: negative    Medications  Scheduled Meds:metoprolol (LOPRESSOR) injection 2.5 mg, 2.5 mg, Intravenous, Q6H*  phytonadione (vitamin K1) 10 mg in dextrose 5% (D5W) 50 mL IVPB, 10 mg, Intravenous, ONCE  polyethylene glycol 3350 (MIRALAX) packet 17 g, 17 g, Oral, QDAY  senna/docusate (SENOKOT-S) tablet 1 tablet, 1 tablet, Oral, BID  SODIUM CHLORIDE 0.9 % IV SOLP (Cabinet Override), , , NOW    Continuous Infusions:  PRN and Respiratory Meds:fentaNYL citrate PF Q1H PRN      Objective                       Vital Signs: Last Filed                 Vital Signs: 24 Hour Range   BP: 109/71 (12/12 1300)  Temp: 36.7 ?C (98.1 ?F) (12/12 1200)  Pulse: 83 (12/12 1300)  Respirations: 17 PER MINUTE (12/12 1300)  SpO2: 97 % (12/12 1300)  O2 Device: None (Room air) (12/12 1300)  Height: 175.3 cm (5' 9) (12/12 1016) BP: (101-113)/(49-77)   Temp:  [36.7 ?C (98 ?F)-36.7 ?C (98.1 ?F)]   Pulse:  [79-88]   Respirations:  [15 PER MINUTE-19 PER MINUTE]  SpO2:  [97 %-98 %]   O2 Device: None (Room air)   Intensity Pain Scale (Self Report): 4 (03/27/21 1200) Vitals:    03/27/21 1016   Weight: 82.6 kg (182 lb)         Intake/Output Summary:  (Last 24 hours)    Intake/Output Summary (Last 24 hours) at 03/27/2021 1346  Last data filed at 03/27/2021 1300  Gross per 24 hour   Intake --   Output 185 ml   Net -185 ml           Physical Exam  General appearance: alert and no distress  Neurologic: Grossly normal, at baseline  Lungs: Nonlabored with normal effort  Abdomen: soft, non-tender.   Extremities: extremities normal, atraumatic, no cyanosis or edema      Airway:  airway assessment performed  Mallampati II (soft palate, uvula, fauces visible)   Anesthesia Classification:  ASA III (A patient with a severe systemic disease that limits activity, but is not incapacitating)  Pre procedure anxiolysis plan: Midazolam  Intra-procedural Sedation/Medication Plan: Fentanyl and Midazolam  Personal history of sedation complications: Denies adverse event. Family history of sedation complications: Denies adverse event.   Medications for Reversal: Naloxone and romazicon  Discussion/Reviews:  Physician has discussed risks and alternatives of this type of sedation and above planned procedures with patient  NPO Status: Acceptable   Pregnancy Status: N/A    Lab/Radiology/Other Diagnostic Tests:  Labs:  Pertinent labs reviewed  Radiology: Reviewed.

## 2021-03-27 NOTE — Other
Immediate Post Procedure Note    Date:  03/27/2021                                         Attending Physician:   Dr. Leia Alf  Performing Provider:  Madalyn Rob, MD    Consent:  Consent obtained from patient.  Time out performed: Consent obtained, correct patient verified, correct procedure verified, correct site verified, patient marked as necessary.  Pre/Post Procedure Diagnosis:  Active bleed  Indications:  Active bleed      Procedure(s):  Arteriogram  Findings:  Active extravasation from obturator artery branch successfully coil embolized.      Estimated Blood Loss:  None/Negligible  Specimen(s) Removed/Disposition:  None  Complications: None  Patient Tolerated Procedure: Well  Post-Procedure Condition:  stable    Madalyn Rob, MD

## 2021-03-27 NOTE — Progress Notes
RT Adult Assessment Note    NAME:Andrew M Chesbro Sr.             MRN: [id number]             DOB:[birth year]          AGE: 87 y.o.  ADMISSION DATE: 03/27/2021             DAYS ADMITTED: LOS: 0 days    RT Treatment Plan:       Protocol Plan: Procedures  PAP: Place a nursing order for "IS Q1h While Awake" for any of Lung Expansion indicators    Additional Comments:  Impressions of the patient: pt resting comfortably  Intervention(s)/outcome(s):rt eval completed  Patient education that was completed: is with rn  Recommendations to the care team: n/a    Vital Signs:  Pulse: 83  RR: 16 PER MINUTE  SpO2: 97 %  O2 Device: None (Room air)  Liter Flow:    O2%:    Breath Sounds:    Respiratory Effort:

## 2021-03-27 NOTE — Progress Notes
Cervical Spine Clearance    Radiology reviewed personally    Patient alert, oriented, and cooperative    No gross motor or sensory deficits in bilateral upper and lower extremities    No cervical spine tenderness to palpation    Collar removed, and there was no cervical spine pain with active range of motion      C-spine cleared clinically    Tor Netters, MD   [phone]

## 2021-03-27 NOTE — Progress Notes
Sedation physician present in room.  Recent vitals and patient condition reviewed between sedating physician and nurse.  Reassessment completed.  Determination made to proceed with planned sedation.

## 2021-03-28 ENCOUNTER — Inpatient Hospital Stay: Admit: 2021-03-28 | Discharge: 2021-03-28 | Payer: MEDICARE

## 2021-03-28 MED ADMIN — NITROFURANTOIN MONOHYD/M-CRYST 100 MG PO CAP [10724]: 100 mg | ORAL | @ 22:00:00 | NDC 50268062511

## 2021-03-28 MED ADMIN — ACETAMINOPHEN 500 MG PO TAB [102]: 1000 mg | ORAL | @ 22:00:00 | NDC 00904673061

## 2021-03-28 MED ADMIN — HYDROXYZINE HCL 25 MG PO TAB [3774]: 50 mg | ORAL | @ 08:00:00 | Stop: 2021-03-28 | NDC 00904661761

## 2021-03-28 NOTE — Consults
[facility] Consult Note      Admission Date: 03/27/2021                                                  Chief Complaint/Reason for Consult:  L sup/inf pubic rami fx's, L elbow pain    Assessment/Plan  Andrew Friends Babson Sr. is a 87 y.o. male w/ osteoporosis, A. fib on warfarin, who presented after a fall with L superior/inferior pubic rami fractures and pubic hematoma status post IR embolization, L elbow pain    - Images, history and clinical presentation consistent with L superior/inferior pubic rami fx's s/p IR embolization    - Operative Intervention - No acute operative intervention at this time.  Patient had a normal L elbow exam and may increase activity as tolerated. In regards to his left pubic rami fractures, patient okay to be weightbearing as tolerated and anteversion films were ordered to be completed after the patient ambulates and we will review to assess for instability.  - WB Status - WBAT BLE, LUE  - Antibiotics / Tetanus - per primary  - Pain Control - per primary  - Diet - per primary  - DVT PPX - Mechanical, Chemoprophylaxis  - per primary    Patient discussed with staff surgeon Dr. Weston Settle.      During normal business hours, please contact Tiffany Ward, Marcial Pacas. At all other times, contact the orthopedic surgery resident on call for any questions or concerns.     ______________________________________________________________________    History of Present Illness: Andrew BOWDEN Sr. is a 87 y.o. male  w/ osteoporosis, A. fib on warfarin, who presented after a fall. Patient is a very poor historian and reports he last fell 6 months ago but did not realize he fell recently and is overall confused about why he is in the hospital.  Per chart review, patient fell out of a chair today and reports frequent falls in the past with no known cause.  Primary team has been reversing his INR status post IR embolization.  Today, he reports left hip pain but otherwise has no concerns.  Denies any L elbow pain and has been ranging his L elbow fully.  Denies any fever/chills.  Patient reports he lives at home with his wife.    Medical History:   Diagnosis Date   ? Degenerative arthritis 03/31/2014   ? GERD (gastroesophageal reflux disease) 03/31/2014   ? Hearing impairment 03/31/2014   ? Hypertension 03/31/2014   ? Skin cancer 03/31/2014   ? Visual impairment 03/31/2014     Surgical History:   Procedure Laterality Date   ? REMOVAL AND REPLACEMENT PERMANENT PACEMAKER GENERATOR  - DUAL LEAD SYSTEM Left 11/17/2020    Performed by Smith Delphin, MD at [facility]     Social History     Tobacco Use   ? Smoking status: Never   ? Smokeless tobacco: Never   Vaping Use   ? Vaping Use: Never used   Substance Use Topics   ? Alcohol use: No   ? Drug use: No     No family history on file.  Allergies:  Patient has no known allergies.    Outpatient Medications as of 03/27/2021   Medication Sig Dispense Refill   ? aspirin EC 81 mg tablet Take 1 tablet by mouth daily.  Take with food. 90 tablet 3   ? metoprolol succinate XL (TOPROL XL) 25 mg extended release tablet Take one-half tablet by mouth daily. 45 tablet 3   ? nitrofurantoin monohyd/m-cryst (MACROBID) 100 mg capsule Take 100 mg by mouth daily.     ? nitroglycerin (NITROSTAT) 0.4 mg tablet Place 1 tablet under tongue every 5 minutes as needed for Chest Pain. Max of 3 tablets, call 911. 25 tablet 1   ? tamsulosin (FLOMAX) 0.4 mg capsule Take 0.4 mg by mouth daily. Do not crush, chew or open capsules. Take 30 minutes following the same meal each day.     ? warfarin (COUMADIN) 1 mg tablet TAKE 1 TABLET BY MOUTH ONCE DAILY OR AS DIRECTED BY  CARDIOLOGY 90 tablet 0   ? warfarin (COUMADIN) 5 mg tablet Take 1 tablet by mouth once daily 90 tablet 3         Review of Systems:  10 Point Review of Systems Obtained. Pertinent items noted in HPI.       Vital Signs:  Last Filed in 24 hours   BP: 105/67 (12/12 1800)  Temp: 36.9 ?C (98.5 ?F) (12/12 1600)  Pulse: 80 (12/12 1800)  Respirations: 15 PER MINUTE (12/12 1800)  SpO2: 96 % (12/12 1800)  O2 Device: None (Room air) (12/12 1800)  O2 Liter Flow: 2 Lpm (12/12 1535)  SpO2 Pulse: 80 (12/12 1535)  Height: 175.3 cm (5' 9) (12/12 1016)     Physical Exam:    Constitutional: Alert, NAD  HEENT: Normocephalic, no scleral icterus  Respiratory: Unlabored respirations  Cardiovascular: Regular rate  Abdomen: Non-distended  Lymph: No significant lymphedema, no lymphadenopathy of affected extremity  Skin: Normal Turgor  Musculoskeletal:    LUE: Able to flex/extend the elbow, wrist and fingers; AIN/PIN/ulnar motor function intact, SILT in radial/median/ulnar nerve distributions; 2+ radial pulse, hand well perfused, soft compartments,  warm extremity, <2s cap refill.  Full ROM without pain.  Nontender to palpation.  Small superficial abrasion to the posterior L elbow.   RLE: NVI distally, TA/GS, EHL/FHL intact.  SILT in DP/SP/Tib/Sur/Saph distributions, DP/PT palpable, Cap refill < 2 sec, warm extremity, soft compartments.   LLE: NVI distally, TA/GS, EHL/FHL intact.  SILT in DP/SP/Tib/Sur/Saph distributions, DP/PT palpable, Cap refill < 2 sec, soft compartments.  Pain with L hip flexion.  TTP over L pubic rami.    Neurologic: Grossly intact, No focal deficits noted unless listed above    Lab/Radiology/Other Diagnostic Tests:    CBC w/Diff   Lab Results   Component Value Date/Time    WBC 10.8 03/27/2021 10:35 AM    HGB 13.4 (L) 03/27/2021 10:35 AM    HCT 39.0 (L) 03/27/2021 10:35 AM    PLTCT 140 (L) 03/27/2021 10:35 AM        Inflammatory Markers   No results found for: ESR, CRP     Basic Metabolic Profile   Lab Results   Component Value Date/Time    NA 133 (L) 03/27/2021 10:35 AM    K 4.5 03/27/2021 10:35 AM    CL 102 03/27/2021 10:35 AM    CO2 21 03/27/2021 10:35 AM    GAP 10 03/27/2021 10:35 AM    BUN 13 03/27/2021 10:35 AM    CR 0.82 03/27/2021 10:35 AM    GLU 100 03/27/2021 10:35 AM        Coagulation Studies   Lab Results   Component Value Date/Time    PT 16.5 (H) 03/27/2021 01:34 PM  PTT 36.4 03/27/2021 01:34 PM    INR 1.4 (H) 03/27/2021 01:34 PM    INR 2.4 09/30/2020 12:00 AM        Radiology:   CT CHEST W CONTRAST    Result Date: 03/27/2021  CT chest and  CT abdomen and pelvis Indication:  Fall Technique: Dynamic IV contrast-enhanced images were obtained through the chest abdomen and pelvis. Comparison studies:  None Chest findings: 1. Heart and great vessels: Mild generalized cardiomegaly with cardiac pacing electrodes in place.  Moderate calcified coronary artery plaque.  Aorta unremarkable apart from mild mixed plaque. 2. Mediastinum, Axillae and pulmonary hila:  No fluid collection or pneumomediastinum.  Small hiatal hernia. 3. Lungs and pleura:  Poor expansion of the lungs with mild to moderate dependent atelectasis greater on the right.  Minimal right pleural effusion.  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 4. Chest wall and thoracic spine:  Moderate spondylosis and demineralization.  No thoracic spine or sternal fractures identified.  No displaced rib fractures are seen. Abdomen and Pelvis findings: 1. Liver and spleen:  Unremarkable. 2. Adrenal glands and kidneys:  Unremarkable. 3. Pancreas and retroperitoneum:  Pancreas unremarkable. 4. Peritoneal space:  Bowel loops normal caliber.  No hemoperitoneum. 5. Pelvis:  Bladder collapsed with Foley catheter in place. 6.  Musculoskeletal: Comminuted fracture of the left pubic bone and left inferior pubic ramus with mild displacement of pubic fracture fragments.  Moderate size.  Pubic hematoma extending cephalad from the symphysis pubis.  There are small areas of contrast extravasation immediately superior and posterior to the left pubic fracture site indicating brisk active bleeding.     Chest:  1.  No major thoracic injury. 2.  Mild to moderate dependent atelectasis with minimal right pleural effusion. Abdomen and Pelvis:  1.  No major visceral injury or hemoperitoneum. 2.  Comminuted mildly displaced left pubic fracture with active bleeding and moderate size suprapubic hematoma.  Finalized by Merlene Laughter, M.D. on 03/27/2021 12:57 PM. Dictated by Merlene Laughter, M.D. on 03/27/2021 12:47 PM.     CT SPINE CERVICAL WO CONTRAST    Result Date: 03/27/2021  EXAM: CT C-SPINE HISTORY:  s/p fall. TECHNIQUE: Multiple contiguous axial images were obtained of the cervical spine without intravenous contrast. Sagittal and coronal reformations were obtained. Comparison: None FINDINGS: No acute fracture or traumatic subluxation. Trace retrolisthesis at C3-C4 and mild anterolisthesis at C7-T1. The vertebral body heights are maintained. Cervical spine degenerative changes with uncovertebral and facet joint arthropathy, anterior osteophyte formation, posterior osteophyte complexes and marked subchondral cystic degenerative changes. There is multilevel loss of disc space greatest at C3-C4 and C5-C6. There is multilevel spinal canal stenosis up to mild degree at C3-C4. There is multilevel neural foraminal stenosis to severe degree at C3-C4 on the left.  The paraspinous soft tissues are unremarkable. The lung apices are clear.     1.  No acute fracture or traumatic subluxation. 2.  Degenerative changes cervical spine with multilevel spinal canal stenosis of mild degree at C3-C4 and neural foraminal stenosis to severe degree at C3-C4 on the left. 3.  Multilevel loss of disc space greatest at C3-C4 and C5-C6. 4.  Trace retrolisthesis at C3-C4 mild anterolisthesis at C7-T1. By my electronic signature, I attest that I have personally reviewed the images for this examination and formulated the interpretations and opinions expressed in this report  Finalized by Claudina Lick, M.D. on 03/27/2021 3:38 PM. Dictated by Myrtie Soman, MD on 03/27/2021 2:54 PM.     ELBOW  2 VIEWS LEFT    Result Date: 03/27/2021  Exam: FOREARM 2 VIEWS LEFT, ELBOW 2 VIEWS LEFT CLINICAL INDICATION: 89 years pain s/p fall. COMPARISON: None.     1.  Slight uplifting of the anterior fat pad at the elbow suggesting small underlying effusion. No discrete fracture visualized. Given trauma, findings may reflect underlying occult fracture. If symptoms persist consider follow-up imaging for further evaluation. 2.  No acute osseous abnormality visualized throughout the more distal forearm. Radiocarpal alignment appears maintained. 3.  Mild multifocal degenerative arthritis at the wrist. Scattered enthesopathic changes. 4.  Minimal soft tissue swelling at the dorsal olecranon which may reflect soft tissue contusion or bursitis. 5.  Dense vascular calcification.  Finalized by Claudina Lick, M.D. on 03/27/2021 11:52 AM. Dictated by Claudina Lick, M.D. on 03/27/2021 11:47 AM.     FOREARM 2 VIEWS LEFT    Result Date: 03/27/2021  Exam: FOREARM 2 VIEWS LEFT, ELBOW 2 VIEWS LEFT CLINICAL INDICATION: 89 years pain s/p fall. COMPARISON: None.     1.  Slight uplifting of the anterior fat pad at the elbow suggesting small underlying effusion. No discrete fracture visualized. Given trauma, findings may reflect underlying occult fracture. If symptoms persist consider follow-up imaging for further evaluation. 2.  No acute osseous abnormality visualized throughout the more distal forearm. Radiocarpal alignment appears maintained. 3.  Mild multifocal degenerative arthritis at the wrist. Scattered enthesopathic changes. 4.  Minimal soft tissue swelling at the dorsal olecranon which may reflect soft tissue contusion or bursitis. 5.  Dense vascular calcification.  Finalized by Claudina Lick, M.D. on 03/27/2021 11:52 AM. Dictated by Claudina Lick, M.D. on 03/27/2021 11:47 AM.     DEVICE EVALUATION - PPM (PERMANENT PACEMAKER)    Result Date: 03/20/2021  Title: In-Office Check  [03/20/2021 10:14:06 AM - Lonell Grandchild, STEPHANIE]/KK # [facility] FULL check  * programming for dual chamber Medtronic PPM. Device/lead function: within expected limits Battery: 12.6 years remaining Presenting EGM shows AP-VS 80's bpm Underlying rhythm: AS-VS 60-70's bpm New events: Multiple event labeled SVT, NS-VT and fast A& V. All EGM's show ST/AT with 1:1 conduction. Most show with abrupt onset, all appear atrially driven with A/V rates from 150-200 bpm. Most recent episodes 03/20/21 @ 1041 lasting ~ 1 seconds, Max A/V rates 190's bpm. Longest on 02/27/21 @ 1447 lasting 2.5 minutes, Max A/V rates 190's bpm. 5.4% RV paced Programming changes: None Carelink remote connectivity confirmed       CT ABD/PELV W CONTRAST    Result Date: 03/27/2021  CT chest and  CT abdomen and pelvis Indication:  Fall Technique: Dynamic IV contrast-enhanced images were obtained through the chest abdomen and pelvis. Comparison studies:  None Chest findings: 1. Heart and great vessels: Mild generalized cardiomegaly with cardiac pacing electrodes in place.  Moderate calcified coronary artery plaque.  Aorta unremarkable apart from mild mixed plaque. 2. Mediastinum, Axillae and pulmonary hila:  No fluid collection or pneumomediastinum.  Small hiatal hernia. 3. Lungs and pleura:  Poor expansion of the lungs with mild to moderate dependent atelectasis greater on the right.  Minimal right pleural effusion.  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 4. Chest wall and thoracic spine:  Moderate spondylosis and demineralization.  No thoracic spine or sternal fractures identified.  No displaced rib fractures are seen. Abdomen and Pelvis findings: 1. Liver and spleen:  Unremarkable. 2. Adrenal glands and kidneys:  Unremarkable. 3. Pancreas and retroperitoneum:  Pancreas unremarkable. 4. Peritoneal space:  Bowel loops normal caliber.  No hemoperitoneum.  5. Pelvis:  Bladder collapsed with Foley catheter in place. 6.  Musculoskeletal: Comminuted fracture of the left pubic bone and left inferior pubic ramus with mild displacement of pubic fracture fragments.  Moderate size.  Pubic hematoma extending cephalad from the symphysis pubis.  There are small areas of contrast extravasation immediately superior and posterior to the left pubic fracture site indicating brisk active bleeding.     Chest:  1.  No major thoracic injury. 2.  Mild to moderate dependent atelectasis with minimal right pleural effusion. Abdomen and Pelvis:  1.  No major visceral injury or hemoperitoneum. 2.  Comminuted mildly displaced left pubic fracture with active bleeding and moderate size suprapubic hematoma.  Finalized by Merlene Laughter, M.D. on 03/27/2021 12:57 PM. Dictated by Merlene Laughter, M.D. on 03/27/2021 12:47 PM.     IR PELVIS ARTERIOGRAM DIAGNOSTIC WITH INTERVENTION    Result Date: 03/27/2021  ARTERIOGRAM WITH COIL EMBOLIZATION CLINICAL HISTORY:  Fall. Pelvic fractures. Active contrast extravasation. OPERATOR: Gilmer Mor, M.D. and Carlis Stable, M.D. MEDICATIONS:  Subcutaneous lidocaine, IV fentanyl and Versed CONTRAST:  75 mL DOSE: 547 mGy TECHNIQUE: The risks, benefits, and alternatives to the procedure and sedation were explained, and written informed consent obtained. The patient was placed in supine position on the angiography table and the right groin was prepped and draped in sterile fashion. Ultrasound of the right groin was performed demonstrating patency of the common femoral artery. An ultrasound image was obtained and stored in PACS. The skin and subcutaneous tissue overlying the right common femoral artery were infiltrated with 2% lidocaine for local anesthetic. Under ultrasound guidance, the right common femoral artery was accessed with a micropuncture needle. A 0.018 wire was advanced through the needle into the artery. The needle was exchanged for a 5 [place] transitional catheter. The inner dilator and wire were removed and a small amount of contrast was injected. Next, a 0.035 Bentson wire was advanced into the artery. The transitional catheter was exchanged for long 6 [place] vascular sheath, attached to a heparinized pressurized bag of saline. A 5 French Omni Flush catheter was advanced over the wire through the sheath and positioned in the intra-abdominal aorta.   Digital subtraction angiography was performed using non-ionic contrast. The catheter was removed, and utilizing a 035 Glidewire, Bentson wire, and a quick cross catheter the left common iliac artery was selected. Digital subtraction angiography was performed. Next, using a progreat microcatheter and wire the internal iliac artery was selected and digital subtraction angiography was performed. Anterior division of the internal iliac artery was then subselected with the program microcatheter and angiography was performed. Next, the obturator artery was superselected and a left pubic branch was coil embolized using a 2 mm x 6 cm coil. Postembolization angiography was performed. The images were reviewed, and the catheter removed over a wire. The sheath was removed and hemostasis was achieved with a 6 French Angio-Seal. The procedure was well-tolerated, and the patient discharged in stable condition. I was personally responsible for the administration of moderate sedation services during the procedure performed and I confirm requirements described in CPT section on moderate sedation were followed, including the use of an independent trained observer who had no other duties during the procedure. Please see the nursing log the drug(s) details. The total face-to-face time was 15 or more minutes. FINDINGS: 1. Normal caliber abdominal aorta with mild atherosclerotic plaque. 2. Left common iliac artery angiography demonstrating internal and external iliac arteries which demonstrate patency. Subtle note of contrast extravasation noted about  the pubic ramus. 3. Left internal iliac artery angiography demonstrating further extravasation from a branch arising from the obturator artery. 4. Left anterior division of internal iliac artery angiography demonstrating further extravasation of obturator branch. 5. Left obturator arterial angiography demonstrating a left pubic ramus drainage with active extravasation. 6. Subselection of the left pubic ramus artery off of the obturator artery with angiography demonstrating extravasation of contrast. 7. Postembolization left obturator artery demonstrating stasis and no evidence of new bleed.     Active contrast extravasation from an obturator artery branch, successfully coil embolized. I, Carlis Stable, M.D, the attending radiologist, was available for the critical and key portions of the procedure with a midlevel, resident, and/or fellow participating.  Overlapping portions were non key and I was immediately available.  I interpret the critical and key portion of this procedure. @TT   Approved by Gilmer Mor, MD on 03/27/2021 3:54 PM By my electronic signature, I attest that I have personally reviewed the images for this examination and formulated the interpretations and opinions expressed in this report  Finalized by Carlis Stable, M.D. on 03/27/2021 4:08 PM. Dictated by Gilmer Mor, MD on 03/27/2021 3:50 PM.       Brennan Bailey, MD  [phone]

## 2021-03-28 NOTE — Progress Notes
2100 unable to complete ANTEVERSION SERIES PELVIS scan d/t contrast still in bladder. MD notified. Plan to rescan in 24 hours.

## 2021-03-29 MED ADMIN — WARFARIN 5 MG PO TAB [8751]: 2.5 mg | ORAL | @ 03:00:00 | NDC 62584099411

## 2021-03-29 MED ADMIN — ACETAMINOPHEN 500 MG PO TAB [102]: 1000 mg | ORAL | @ 21:00:00 | NDC 00904673061

## 2021-03-29 MED ADMIN — METOPROLOL TARTRATE 25 MG PO TAB [37637]: 12.5 mg | ORAL | @ 15:00:00 | NDC 62584026511

## 2021-03-29 MED ADMIN — ACETAMINOPHEN 500 MG PO TAB [102]: 1000 mg | ORAL | @ 15:00:00 | NDC 00904673061

## 2021-03-29 MED ADMIN — METOPROLOL TARTRATE 25 MG PO TAB [37637]: 12.5 mg | ORAL | @ 03:00:00 | NDC 62584026511

## 2021-03-29 MED ADMIN — POTASSIUM CHLORIDE 20 MEQ PO TBTQ [35943]: 60 meq | ORAL | @ 15:00:00 | Stop: 2021-03-29 | NDC 00832532511

## 2021-03-29 MED ADMIN — NITROFURANTOIN MONOHYD/M-CRYST 100 MG PO CAP [10724]: 100 mg | ORAL | @ 15:00:00 | NDC 50268062511

## 2021-03-29 MED ADMIN — ACETAMINOPHEN 500 MG PO TAB [102]: 1000 mg | ORAL | @ 03:00:00 | NDC 00904673061

## 2021-03-29 MED ADMIN — TRAZODONE 50 MG PO TAB [8085]: 25 mg | ORAL | @ 03:00:00 | NDC 50111056001

## 2021-03-30 MED ADMIN — ACETAMINOPHEN 500 MG PO TAB [102]: 1000 mg | ORAL | @ 02:00:00 | NDC 00904673061

## 2021-03-30 MED ADMIN — WARFARIN 5 MG PO TAB [8751]: 2.5 mg | ORAL | @ 02:00:00 | NDC 62584099411

## 2021-03-30 MED ADMIN — METOPROLOL TARTRATE 25 MG PO TAB [37637]: 12.5 mg | ORAL | @ 15:00:00 | NDC 62584026511

## 2021-03-30 MED ADMIN — ACETAMINOPHEN 500 MG PO TAB [102]: 1000 mg | ORAL | @ 21:00:00 | NDC 00904673061

## 2021-03-30 MED ADMIN — METOPROLOL TARTRATE 25 MG PO TAB [37637]: 12.5 mg | ORAL | @ 02:00:00 | NDC 62584026511

## 2021-03-30 MED ADMIN — ACETAMINOPHEN 500 MG PO TAB [102]: 1000 mg | ORAL | @ 15:00:00 | NDC 00904673061

## 2021-03-30 MED ADMIN — TRAZODONE 50 MG PO TAB [8085]: 25 mg | ORAL | @ 02:00:00 | NDC 50111056001

## 2021-03-30 MED ADMIN — TAMSULOSIN 0.4 MG PO CAP [80077]: 0.4 mg | ORAL | @ 18:00:00 | NDC 00904640161

## 2021-03-30 MED ADMIN — NITROFURANTOIN MONOHYD/M-CRYST 100 MG PO CAP [10724]: 100 mg | ORAL | @ 15:00:00 | NDC 68084044611

## 2021-03-31 MED ADMIN — WARFARIN 5 MG PO TAB [8751]: 2.5 mg | ORAL | @ 02:00:00 | NDC 62584099411

## 2021-03-31 MED ADMIN — ACETAMINOPHEN 500 MG PO TAB [102]: 1000 mg | ORAL | @ 02:00:00 | NDC 00904673061

## 2021-03-31 MED ADMIN — TRAZODONE 50 MG PO TAB [8085]: 25 mg | ORAL | @ 02:00:00 | NDC 50111056001

## 2021-03-31 MED ADMIN — ACETAMINOPHEN 500 MG PO TAB [102]: 1000 mg | ORAL | @ 16:00:00 | NDC 00904673061

## 2021-03-31 MED ADMIN — TAMSULOSIN 0.4 MG PO CAP [80077]: 0.4 mg | ORAL | @ 16:00:00 | NDC 00904640161

## 2021-03-31 MED ADMIN — NITROFURANTOIN MONOHYD/M-CRYST 100 MG PO CAP [10724]: 100 mg | ORAL | @ 16:00:00 | NDC 68084044611

## 2021-03-31 MED ADMIN — METOPROLOL TARTRATE 25 MG PO TAB [37637]: 12.5 mg | ORAL | @ 16:00:00 | NDC 62584026511

## 2021-03-31 MED ADMIN — METOPROLOL TARTRATE 25 MG PO TAB [37637]: 12.5 mg | ORAL | @ 02:00:00 | NDC 62584026511

## 2021-04-01 MED ADMIN — NITROFURANTOIN MONOHYD/M-CRYST 100 MG PO CAP [10724]: 100 mg | ORAL | @ 16:00:00 | NDC 68084044611

## 2021-04-01 MED ADMIN — METOPROLOL TARTRATE 25 MG PO TAB [37637]: 12.5 mg | ORAL | @ 02:00:00 | NDC 62584026511

## 2021-04-01 MED ADMIN — ACETAMINOPHEN 500 MG PO TAB [102]: 1000 mg | ORAL | @ 02:00:00 | NDC 00904673061

## 2021-04-01 MED ADMIN — TRAZODONE 50 MG PO TAB [8085]: 25 mg | ORAL | @ 02:00:00 | NDC 50111056001

## 2021-04-01 MED ADMIN — ACETAMINOPHEN 500 MG PO TAB [102]: 1000 mg | ORAL | @ 16:00:00 | NDC 00904673061

## 2021-04-01 MED ADMIN — TAMSULOSIN 0.4 MG PO CAP [80077]: 0.4 mg | ORAL | @ 16:00:00 | NDC 00904640161

## 2021-04-01 MED ADMIN — METOPROLOL TARTRATE 25 MG PO TAB [37637]: 12.5 mg | ORAL | @ 16:00:00 | NDC 62584026511

## 2021-04-01 MED ADMIN — WARFARIN 5 MG PO TAB [8751]: 2.5 mg | ORAL | @ 02:00:00 | NDC 62584099411

## 2021-04-02 MED ADMIN — WARFARIN 5 MG PO TAB [8751]: 2.5 mg | ORAL | @ 03:00:00 | NDC 62584099411

## 2021-04-02 MED ADMIN — ACETAMINOPHEN 500 MG PO TAB [102]: 1000 mg | ORAL | @ 03:00:00 | NDC 00904673061

## 2021-04-02 MED ADMIN — ACETAMINOPHEN 500 MG PO TAB [102]: 1000 mg | ORAL | @ 15:00:00 | NDC 00904673061

## 2021-04-02 MED ADMIN — TRAZODONE 50 MG PO TAB [8085]: 25 mg | ORAL | @ 03:00:00 | NDC 50111056001

## 2021-04-02 MED ADMIN — NITROFURANTOIN MONOHYD/M-CRYST 100 MG PO CAP [10724]: 100 mg | ORAL | @ 15:00:00 | NDC 68084044611

## 2021-04-02 MED ADMIN — METOPROLOL TARTRATE 25 MG PO TAB [37637]: 12.5 mg | ORAL | @ 15:00:00 | NDC 62584026511

## 2021-04-02 MED ADMIN — TAMSULOSIN 0.4 MG PO CAP [80077]: 0.4 mg | ORAL | @ 15:00:00 | NDC 00904640161

## 2021-04-02 MED ADMIN — METOPROLOL TARTRATE 25 MG PO TAB [37637]: 12.5 mg | ORAL | @ 03:00:00 | NDC 62584026511

## 2021-04-03 ENCOUNTER — Encounter: Admit: 2021-04-03 | Discharge: 2021-04-03 | Payer: MEDICARE

## 2021-04-03 MED ADMIN — NITROFURANTOIN MONOHYD/M-CRYST 100 MG PO CAP [10724]: 100 mg | ORAL | @ 15:00:00 | Stop: 2021-04-03 | NDC 68084044611

## 2021-04-03 MED ADMIN — TAMSULOSIN 0.4 MG PO CAP [80077]: 0.4 mg | ORAL | @ 15:00:00 | Stop: 2021-04-03 | NDC 00904640161

## 2021-04-03 MED ADMIN — TRAZODONE 50 MG PO TAB [8085]: 25 mg | ORAL | @ 03:00:00 | NDC 50111056001

## 2021-04-03 MED ADMIN — ACETAMINOPHEN 325 MG PO TAB [101]: 1000 mg | ORAL | @ 03:00:00 | NDC 00904677361

## 2021-04-03 MED ADMIN — METOPROLOL TARTRATE 25 MG PO TAB [37637]: 12.5 mg | ORAL | @ 15:00:00 | Stop: 2021-04-03 | NDC 62584026511

## 2021-04-03 MED ADMIN — METOPROLOL TARTRATE 25 MG PO TAB [37637]: 12.5 mg | ORAL | @ 03:00:00 | NDC 62584026511

## 2021-04-03 MED ADMIN — ACETAMINOPHEN 500 MG PO TAB [102]: 1000 mg | ORAL | @ 15:00:00 | Stop: 2021-04-03 | NDC 00904673061

## 2021-04-03 MED ADMIN — WARFARIN 5 MG PO TAB [8751]: 2.5 mg | ORAL | @ 03:00:00 | NDC 62584099411

## 2021-04-11 ENCOUNTER — Encounter: Admit: 2021-04-11 | Discharge: 2021-04-11 | Payer: MEDICARE

## 2021-04-11 DIAGNOSIS — I4891 Unspecified atrial fibrillation: Secondary | ICD-10-CM

## 2021-04-11 DIAGNOSIS — Z7901 Long term (current) use of anticoagulants: Secondary | ICD-10-CM

## 2021-04-12 ENCOUNTER — Encounter: Admit: 2021-04-12 | Discharge: 2021-04-12 | Payer: MEDICARE

## 2021-04-12 NOTE — Telephone Encounter
Received notification that  [employer]tr[employer] Carelink has not been connected since 03/24/21. Patient was instructed to look at his/her transmitter to make sure that it is plugged into power and send a manual transmission to reconnect the transmitter. If he/she has any questions about how to send a transmission or if the transmitter does not appear to be working properly, they need to contact the device company directly. Patient was provided with that contact number. Requested the patient send [place] a MyChart message or contact our device nurses at [phone] to let us know after they have sent their transmission. Unable to leave voice mail at time of call. BC     Note: Patient needs to send a manual remote interrogation to reestablish communication to his/her remote transmitter.

## 2021-04-14 ENCOUNTER — Encounter: Admit: 2021-04-14 | Discharge: 2021-04-14 | Payer: MEDICARE

## 2021-04-19 ENCOUNTER — Encounter: Admit: 2021-04-19 | Discharge: 2021-04-19 | Payer: MEDICARE

## 2021-04-19 DIAGNOSIS — I4891 Unspecified atrial fibrillation: Secondary | ICD-10-CM

## 2021-04-19 DIAGNOSIS — Z7901 Long term (current) use of anticoagulants: Secondary | ICD-10-CM

## 2021-04-19 NOTE — Progress Notes
INR 2.94, called [facility] x2 to get results. Cont 2.5 daily, 1.25 on Friday, re-check 1/11. spoke to the nurse. tc

## 2021-04-27 ENCOUNTER — Encounter: Admit: 2021-04-27 | Discharge: 2021-04-27 | Payer: MEDICARE

## 2021-04-27 DIAGNOSIS — Z7901 Long term (current) use of anticoagulants: Secondary | ICD-10-CM

## 2021-04-27 DIAGNOSIS — I4891 Unspecified atrial fibrillation: Secondary | ICD-10-CM

## 2021-04-27 LAB — PROTIME INR (PT): INR: 3 — ABNORMAL HIGH (ref 0.91–1.30)

## 2021-04-28 ENCOUNTER — Encounter: Admit: 2021-04-28 | Discharge: 2021-04-28 | Payer: MEDICARE

## 2021-04-28 DIAGNOSIS — S32810A Multiple fractures of pelvis with stable disruption of pelvic ring, initial encounter for closed fracture: Secondary | ICD-10-CM

## 2021-05-11 ENCOUNTER — Encounter: Admit: 2021-05-11 | Discharge: 2021-05-11 | Payer: MEDICARE

## 2021-05-11 DIAGNOSIS — I4891 Unspecified atrial fibrillation: Secondary | ICD-10-CM

## 2021-05-11 DIAGNOSIS — Z7901 Long term (current) use of anticoagulants: Secondary | ICD-10-CM

## 2021-05-11 LAB — PROTIME INR (PT): INR: 2.1

## 2021-05-22 ENCOUNTER — Encounter: Admit: 2021-05-22 | Discharge: 2021-05-22 | Payer: MEDICARE

## 2021-05-22 DIAGNOSIS — I4891 Unspecified atrial fibrillation: Secondary | ICD-10-CM

## 2021-05-22 DIAGNOSIS — Z7901 Long term (current) use of anticoagulants: Secondary | ICD-10-CM

## 2021-05-22 LAB — PROTIME INR (PT): INR: 2.7

## 2021-06-13 ENCOUNTER — Encounter: Admit: 2021-06-13 | Discharge: 2021-06-13 | Payer: MEDICARE

## 2021-06-13 DIAGNOSIS — I4891 Unspecified atrial fibrillation: Secondary | ICD-10-CM

## 2021-06-13 DIAGNOSIS — Z7901 Long term (current) use of anticoagulants: Secondary | ICD-10-CM

## 2021-06-13 LAB — PROTIME INR (PT): INR: 2.1 % — ABNORMAL LOW (ref 36–45)

## 2021-06-22 ENCOUNTER — Encounter: Admit: 2021-06-22 | Discharge: 2021-06-22 | Payer: MEDICARE

## 2021-06-22 NOTE — Telephone Encounter
-----   Message from Wynona Meals, RN sent at 06/22/2021  3:00 PM CST -----  Regarding: FW: remote showing significant increase atrial arrythmias needs future device orders TLR    ----- Message -----  From: Olena Mater, RN  Sent: 06/22/2021   2:56 PM CST  To: [facility] Nurse Gen Card Team Yellow  Subject: remote showing significant increase atrial a#    3/8 remote shows lots of AT since 03/2021( 2 treated and 2 monitored AT/AF, 58 "NSVT" and 75 "Fast A&V")  presenting rhythm (06/21/21 @ 1415)shows AT 150's-180's appears to have been ongoing for ~ 79mn at time of transmission (reflected by 6 device detected "episodes" detection is set to 150bpm-->consider lowering to ~140bpm to optimize detection?)     RV pacing has increased significantly as well w/Avg of 18% since Dec and as much as ~60% through most of Jan.     He has had some true NSVT lasting 4-5sec last on 06/15/21 @ 1911 showing ~2sec/7beats of NSVT ~176bpm    And some AF/RVR V rates >100bpm ~20-30% in AF      Pt is due for office follow up in ~December(and/or PRN) with annual PPM check(may need seen sooner to assess AT detection rate and cause for increased RVpacing?).  Orders for follow up are not in the chart.  Please enter orders for OV(if applicable) and future order for PPM eval.      Follow up with patient and scheduling as needed.      Thank you--   -Kaylee/Device Team

## 2021-06-22 NOTE — Telephone Encounter
Spoke with nursing home and wife patient is doing well.  Overbooked with TLR in order to review medications to see if adj need to be made and follow up since discharge from [facility] in Dec

## 2021-06-29 ENCOUNTER — Encounter: Admit: 2021-06-29 | Discharge: 2021-06-29 | Payer: MEDICARE

## 2021-06-29 DIAGNOSIS — H547 Unspecified visual loss: Secondary | ICD-10-CM

## 2021-06-29 DIAGNOSIS — I10 Essential (primary) hypertension: Secondary | ICD-10-CM

## 2021-06-29 DIAGNOSIS — M1990 Unspecified osteoarthritis, unspecified site: Secondary | ICD-10-CM

## 2021-06-29 DIAGNOSIS — R0989 Other specified symptoms and signs involving the circulatory and respiratory systems: Secondary | ICD-10-CM

## 2021-06-29 DIAGNOSIS — K219 Gastro-esophageal reflux disease without esophagitis: Secondary | ICD-10-CM

## 2021-06-29 DIAGNOSIS — C4490 Unspecified malignant neoplasm of skin, unspecified: Secondary | ICD-10-CM

## 2021-06-29 DIAGNOSIS — H9190 Unspecified hearing loss, unspecified ear: Secondary | ICD-10-CM

## 2021-07-06 ENCOUNTER — Encounter: Admit: 2021-07-06 | Discharge: 2021-07-06 | Payer: MEDICARE

## 2021-07-06 NOTE — Telephone Encounter
Pt to send remote on 07/13/21 prior to 07/14/21 RAD OV. I spoke with patient's wife and gave her instructions and the number to carelink to call if needed.

## 2021-07-13 ENCOUNTER — Encounter: Admit: 2021-07-13 | Discharge: 2021-07-13 | Payer: MEDICARE

## 2021-07-13 DIAGNOSIS — I4891 Unspecified atrial fibrillation: Secondary | ICD-10-CM

## 2021-07-13 DIAGNOSIS — Z950 Presence of cardiac pacemaker: Secondary | ICD-10-CM

## 2021-07-13 NOTE — Telephone Encounter
I spoke with patient's wife [name], [employer] remote transmitter is not working, Advice worker will send them a new one.    I notified EP Team C for patient's upcoming visit with RAD.

## 2021-07-14 ENCOUNTER — Ambulatory Visit: Admit: 2021-07-14 | Discharge: 2021-07-15 | Payer: MEDICARE

## 2021-07-14 ENCOUNTER — Encounter: Admit: 2021-07-14 | Discharge: 2021-07-14 | Payer: MEDICARE

## 2021-07-14 ENCOUNTER — Ambulatory Visit: Admit: 2021-07-14 | Discharge: 2021-07-14 | Payer: MEDICARE

## 2021-07-14 DIAGNOSIS — H547 Unspecified visual loss: Secondary | ICD-10-CM

## 2021-07-14 DIAGNOSIS — C4490 Unspecified malignant neoplasm of skin, unspecified: Secondary | ICD-10-CM

## 2021-07-14 DIAGNOSIS — M1990 Unspecified osteoarthritis, unspecified site: Secondary | ICD-10-CM

## 2021-07-14 DIAGNOSIS — H9190 Unspecified hearing loss, unspecified ear: Secondary | ICD-10-CM

## 2021-07-14 DIAGNOSIS — R0989 Other specified symptoms and signs involving the circulatory and respiratory systems: Secondary | ICD-10-CM

## 2021-07-14 DIAGNOSIS — I4891 Unspecified atrial fibrillation: Secondary | ICD-10-CM

## 2021-07-14 DIAGNOSIS — Z950 Presence of cardiac pacemaker: Secondary | ICD-10-CM

## 2021-07-14 DIAGNOSIS — K219 Gastro-esophageal reflux disease without esophagitis: Secondary | ICD-10-CM

## 2021-07-14 DIAGNOSIS — I10 Essential (primary) hypertension: Secondary | ICD-10-CM

## 2021-07-14 MED ORDER — METOPROLOL TARTRATE 25 MG PO TAB
25 mg | ORAL_TABLET | Freq: Two times a day (BID) | ORAL | 3 refills | 90.00000 days | Status: AC
Start: 2021-07-14 — End: ?

## 2021-07-14 NOTE — Progress Notes
Date of Service: 07/14/2021    Andrew Costain Sr. is a 88 y.o. male.       HPI        Dictation on: 07/14/2021 12:21 PM by: Jen Mow [[email or web address]]       Shared Decision Making: We have discussed their unique stroke and bleeding risk both on and off oral-anticoagulation, and the rationale for this referral for transcatheter left atrial appendage closure. Based on both stroke and bleeding risk, a shared decision has been made to pursue transcatheter closure of the left atrial appendage as a safe and effective alternative to oral anticoagulant therapy for stroke prevention and to reduce their long term risk of incidence of intra cerebral bleeding.           Vitals:    07/14/21 0925   BP: 112/78   BP Source: Arm, Left Upper   Pulse: 93   SpO2: 95%   O2 Device: None (Room air)   Weight: 82.6 kg (182 lb)   Height: 172.7 cm (5' 8)     Body mass index is 27.67 kg/m?[name]     Past Medical History  Patient Active Problem List    Diagnosis Date Noted   ? Pelvic ring fracture, closed, initial encounter (HCC) 03/27/2021   ? Left shoulder pain 07/11/2016   ? Chronic anticoagulation 04/01/2014   ? Long term (current) use of anticoagulants 04/01/2014   ? 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 Atrial fibrillation (HCC) 03/31/2014   ? Sinus node dysfunction (HCC) 03/31/2014     [employer] PM      ? Degenerative arthritis 03/31/2014   ? GERD (gastroesophageal reflux disease) 03/31/2014   ? Hearing impairment 03/31/2014   ? Hypertension 03/31/2014   ? Skin cancer 03/31/2014   ? Visual impairment 03/31/2014         Review of Systems   Constitutional: Negative.   HENT: Negative.    Eyes: Negative.    Cardiovascular: Negative.    Respiratory: Negative.    Endocrine: Negative.    Hematologic/Lymphatic: Negative.    Skin: Negative.    Musculoskeletal: Negative.    Gastrointestinal: Negative.    Genitourinary: Negative.    Neurological: Negative.    Psychiatric/Behavioral: Negative.    Allergic/Immunologic: Negative.        Physical Exam  Patient is a moderately well-built gentleman who is comfortable at rest,  not in any distress.  Sclerae anicteric.  The oral mucosa is moist and pink.  Neck is  supple without any lymphadenopathy.  Lungs are clear to auscultation bilaterally.  Breath  sounds are normal.  Cardiac exam reveals normal S1, S2 with regular rate and  rhythm.  No murmurs, rubs or gallops noted.  Abdomen:  Soft, nontender, nondistended.  Bowel sounds are present.  Extremities:  No cyanosis, clubbing or edema.  Peripheral  pulses are symmetric.  Skin without any rash. . No gross motor or neuro deficits.    Cardiovascular Studies      Cardiovascular Health Factors  Vitals BP Readings from Last 3 Encounters:   07/14/21 112/78   06/29/21 130/80   04/03/21 102/64     Wt Readings from Last 3 Encounters:   07/14/21 82.6 kg (182 lb)   06/29/21 81.2 kg (179 lb)   03/27/21 82.6 kg (182 lb)     BMI Readings from Last 3 Encounters:   07/14/21 27.67 kg/m?   06/29/21 27.22 kg/m?   03/27/21 26.88 kg/m?      Smoking Social History  Tobacco Use   Smoking Status Never   Smokeless Tobacco Never      Lipid Profile Cholesterol   Date Value Ref Range Status   11/16/2019 176  Final     HDL   Date Value Ref Range Status   11/16/2019 50  Final     LDL   Date Value Ref Range Status   11/16/2019 114 (H) <100 Final     Triglycerides   Date Value Ref Range Status   11/16/2019 62  Final      Blood Sugar No results found for: HGBA1C  Glucose   Date Value Ref Range Status   04/01/2021 96 70 - 100 MG/DL Final   45/40/9811 914 (H) 70 - 100 MG/DL Final   78/29/5621 85 70 - 100 MG/DL Final     Glucose, POC   Date Value Ref Range Status   03/30/2021 98 70 - 100 MG/DL Final          Problems Addressed Today  Encounter Diagnoses   Name Primary?   ? Cardiovascular symptoms Yes       Assessment and Plan     As above in HPI section.         Current Medications (including today's revisions)  ? acetaminophen (TYLENOL EXTRA STRENGTH) 500 mg tablet Take two tablets by mouth every 6 hours as needed. Max of 4,000 mg of acetaminophen in 24 hours.   ? aspirin EC 81 mg tablet Take one tablet by mouth daily. Take with food.   ? metoprolol tartrate 25 mg tablet Take one-half tablet by mouth twice daily. (Patient taking differently: Take one tablet by mouth twice daily.)   ? nitrofurantoin monohyd/m-cryst (MACROBID) 100 mg capsule Take one capsule by mouth daily.   ? nitroglycerin (NITROSTAT) 0.4 mg tablet Place 1 tablet under tongue every 5 minutes as needed for Chest Pain. Max of 3 tablets, call 911.   ? oxyCODONE (ROXICODONE) 5 mg tablet Take one tablet by mouth every 6 hours as needed.   ? polyethylene glycol 3350 (MIRALAX) 17 g packet Take one packet by mouth daily.   ? senna/docusate (SENOKOT-S) 8.6/50 mg tablet Take one tablet by mouth twice daily.   ? tamsulosin (FLOMAX) 0.4 mg capsule Take one capsule by mouth daily. Do not crush, chew or open capsules. Take 30 minutes following the same meal each day.   ? traZODone (DESYREL) 50 mg tablet Take one-half tablet by mouth at bedtime as needed.   ? warfarin (COUMADIN) 2.5 mg tablet Take one tablet by mouth daily.

## 2021-07-26 ENCOUNTER — Encounter: Admit: 2021-07-26 | Discharge: 2021-07-26 | Payer: MEDICARE

## 2021-07-26 NOTE — Telephone Encounter
INR Overdue. Called and left message requesting pt to have INR drawn at earliest convenience.  Left callback number for any questions or concerns.

## 2021-08-09 ENCOUNTER — Encounter: Admit: 2021-08-09 | Discharge: 2021-08-09 | Payer: MEDICARE

## 2021-08-09 NOTE — Telephone Encounter
[facility] to request INR.  He has been discharged home from the facility.  The phone number listed is out of service.  Called PCPs office and got the following contact numbers for patient.    Home - [phone], penny wilbourn, daughter [phone].    Updated contact information.  Left message requesting callback to discuss needed INR.

## 2021-08-11 ENCOUNTER — Encounter: Admit: 2021-08-11 | Discharge: 2021-08-11 | Payer: MEDICARE

## 2021-08-11 DIAGNOSIS — Z7901 Long term (current) use of anticoagulants: Secondary | ICD-10-CM

## 2021-08-11 DIAGNOSIS — I4891 Unspecified atrial fibrillation: Secondary | ICD-10-CM

## 2021-08-11 LAB — PROTIME INR (PT): INR: 1.7

## 2021-08-18 ENCOUNTER — Encounter: Admit: 2021-08-18 | Discharge: 2021-08-18 | Payer: MEDICARE

## 2021-08-18 DIAGNOSIS — Z7901 Long term (current) use of anticoagulants: Secondary | ICD-10-CM

## 2021-08-18 DIAGNOSIS — I10 Essential (primary) hypertension: Secondary | ICD-10-CM

## 2021-08-18 DIAGNOSIS — I4891 Unspecified atrial fibrillation: Secondary | ICD-10-CM

## 2021-08-18 LAB — PROTIME INR (PT): INR: 1.9 % — ABNORMAL LOW (ref 2.0–3.0)

## 2021-08-18 NOTE — Telephone Encounter
Form completed and faxed to [facility]

## 2021-08-25 ENCOUNTER — Encounter: Admit: 2021-08-25 | Discharge: 2021-08-25 | Payer: MEDICARE

## 2021-08-25 DIAGNOSIS — I4891 Unspecified atrial fibrillation: Secondary | ICD-10-CM

## 2021-08-25 DIAGNOSIS — Z7901 Long term (current) use of anticoagulants: Secondary | ICD-10-CM

## 2021-08-25 LAB — PROTIME INR (PT): INR: 1.8 — ABNORMAL HIGH (ref 0.89–1.11)

## 2021-08-28 ENCOUNTER — Encounter: Admit: 2021-08-28 | Discharge: 2021-08-28 | Payer: MEDICARE

## 2021-08-28 NOTE — Telephone Encounter
[facility] called wanted order for INR will call with results Friday.

## 2021-09-01 ENCOUNTER — Encounter: Admit: 2021-09-01 | Discharge: 2021-09-01 | Payer: MEDICARE

## 2021-09-01 DIAGNOSIS — Z7901 Long term (current) use of anticoagulants: Secondary | ICD-10-CM

## 2021-09-01 DIAGNOSIS — I4891 Unspecified atrial fibrillation: Secondary | ICD-10-CM

## 2021-09-15 ENCOUNTER — Encounter: Admit: 2021-09-15 | Discharge: 2021-09-15 | Payer: MEDICARE

## 2021-09-15 DIAGNOSIS — Z7901 Long term (current) use of anticoagulants: Secondary | ICD-10-CM

## 2021-09-15 DIAGNOSIS — I4891 Unspecified atrial fibrillation: Secondary | ICD-10-CM

## 2021-09-23 ENCOUNTER — Encounter: Admit: 2021-09-23 | Discharge: 2021-09-23 | Payer: MEDICARE

## 2021-09-27 ENCOUNTER — Encounter: Admit: 2021-09-27 | Discharge: 2021-09-27 | Payer: MEDICARE

## 2021-09-27 MED ORDER — WARFARIN 1 MG PO TAB
ORAL_TABLET | ORAL | 3 refills | 90.00000 days | Status: AC
Start: 2021-09-27 — End: ?

## 2021-09-29 ENCOUNTER — Encounter: Admit: 2021-09-29 | Discharge: 2021-09-29 | Payer: MEDICARE

## 2021-09-29 DIAGNOSIS — I4891 Unspecified atrial fibrillation: Secondary | ICD-10-CM

## 2021-09-29 DIAGNOSIS — Z7901 Long term (current) use of anticoagulants: Secondary | ICD-10-CM

## 2021-09-29 LAB — PROTIME INR (PT): INR: 2.4

## 2021-10-04 ENCOUNTER — Encounter: Admit: 2021-10-04 | Discharge: 2021-10-04 | Payer: MEDICARE

## 2021-10-04 DIAGNOSIS — I4891 Unspecified atrial fibrillation: Secondary | ICD-10-CM

## 2021-10-09 ENCOUNTER — Encounter: Admit: 2021-10-09 | Discharge: 2021-10-09 | Payer: MEDICARE

## 2021-10-12 ENCOUNTER — Encounter: Admit: 2021-10-12 | Discharge: 2021-10-12 | Payer: MEDICARE

## 2021-10-12 DIAGNOSIS — H547 Unspecified visual loss: Secondary | ICD-10-CM

## 2021-10-12 DIAGNOSIS — I10 Essential (primary) hypertension: Secondary | ICD-10-CM

## 2021-10-12 DIAGNOSIS — K219 Gastro-esophageal reflux disease without esophagitis: Secondary | ICD-10-CM

## 2021-10-12 DIAGNOSIS — H9190 Unspecified hearing loss, unspecified ear: Secondary | ICD-10-CM

## 2021-10-12 DIAGNOSIS — C4490 Unspecified malignant neoplasm of skin, unspecified: Secondary | ICD-10-CM

## 2021-10-12 DIAGNOSIS — M1990 Unspecified osteoarthritis, unspecified site: Secondary | ICD-10-CM

## 2021-11-03 ENCOUNTER — Encounter: Admit: 2021-11-03 | Discharge: 2021-11-03 | Payer: MEDICARE

## 2021-11-03 DIAGNOSIS — I4891 Unspecified atrial fibrillation: Secondary | ICD-10-CM

## 2021-11-03 LAB — PROTIME INR (PT): INR: 1.9 g/dL — ABNORMAL HIGH (ref 0.89–1.11)

## 2021-11-27 ENCOUNTER — Encounter: Admit: 2021-11-27 | Discharge: 2021-11-27 | Payer: MEDICARE

## 2021-11-28 ENCOUNTER — Encounter: Admit: 2021-11-28 | Discharge: 2021-11-28 | Payer: MEDICARE

## 2021-11-30 ENCOUNTER — Encounter: Admit: 2021-11-30 | Discharge: 2021-11-30 | Payer: MEDICARE

## 2021-11-30 NOTE — Telephone Encounter
Reviewed device report with TLR in clinic. He recommends for patient to keep follow up appointment. As long as patient is asymptomatic no changes at this time.

## 2021-11-30 NOTE — Telephone Encounter
-----   Message from [place], [place] sent at 11/29/2021 10:07 AM CDT -----  Regarding: FW: Ongoing AFL (TLK)    ----- Message -----  From: Truddie Coco, RN  Sent: 11/27/2021   2:02 PM CDT  To: [facility] Nurse Gen Card Team [place]  Subject: Ongoing AFL (TLK)                                We received an alert on pt for the following: AT/AF burden > threshold, Fast V rate during AT/AF, 1 NSVT noted    Presenting rhythm: AFL-VS 80-130 bpm(ongoing since 8/14 @ 0521, alert received @ 0830)  High atrial rate event(s) detected within programmed monitor zone  Total episodes: Multiple events labeled AT/AF, SVT, Fast A&V and NSVT.  Available EGM's show SVT, parox AT and AFL.  AT/AF burden is 0.1% since last remote 09/21/21.  Most recent event is ongoing AFL starting on 11/27/21 @ 0521.  Longest prior to that is 3 hours @ 0226 on 11/27/21 @ 0226. Max A/V rates 500/130 bpm.  Atrial ATP is on and terminated 1/3 episodes. (33%)  V rates in AF are > 100 bpm 60% of the time  Warfarin is on pt medication list    Stored EGMs are consistent with or suggestive of Non-sustained VT  Total episodes: 1 true NSVT on 11/26/21 lasting 2 seconds with AVG V rate 162 bpm      Please see full report in Epic for details and follow up as needed.    Thanks- Soil scientist / Device Team

## 2021-12-11 ENCOUNTER — Encounter: Admit: 2021-12-11 | Discharge: 2021-12-11 | Payer: MEDICARE

## 2021-12-11 NOTE — Telephone Encounter
INR Overdue. Called and left message requesting pt to have INR drawn at earliest convenience.  Left callback number for any questions or concerns.

## 2022-01-02 ENCOUNTER — Encounter: Admit: 2022-01-02 | Discharge: 2022-01-02 | Payer: MEDICARE

## 2022-01-02 DIAGNOSIS — Z950 Presence of cardiac pacemaker: Secondary | ICD-10-CM

## 2022-02-12 ENCOUNTER — Encounter: Admit: 2022-02-12 | Discharge: 2022-02-12 | Payer: MEDICARE

## 2022-02-12 DIAGNOSIS — Z7901 Long term (current) use of anticoagulants: Secondary | ICD-10-CM

## 2022-02-12 DIAGNOSIS — I4891 Unspecified atrial fibrillation: Secondary | ICD-10-CM

## 2022-03-01 ENCOUNTER — Encounter: Admit: 2022-03-01 | Discharge: 2022-03-01 | Payer: MEDICARE

## 2022-03-01 DIAGNOSIS — C4490 Unspecified malignant neoplasm of skin, unspecified: Secondary | ICD-10-CM

## 2022-03-01 DIAGNOSIS — M1990 Unspecified osteoarthritis, unspecified site: Secondary | ICD-10-CM

## 2022-03-01 DIAGNOSIS — H547 Unspecified visual loss: Secondary | ICD-10-CM

## 2022-03-01 DIAGNOSIS — I10 Essential (primary) hypertension: Secondary | ICD-10-CM

## 2022-03-01 DIAGNOSIS — K219 Gastro-esophageal reflux disease without esophagitis: Secondary | ICD-10-CM

## 2022-03-01 DIAGNOSIS — H9190 Unspecified hearing loss, unspecified ear: Secondary | ICD-10-CM

## 2022-03-01 NOTE — Patient Instructions
Thank you for visiting our office today.    We would like to make the following medication adjustments:  NONE      Otherwise continue the same medications as you have been doing.          We will be pursuing the following tests after your appointment today:            We will plan to see you back in 6 months.  Please call us in the meantime with any questions or concerns.        Please allow 5-7 business days for our providers to review your results. All normal results will go to MyChart. If you do not have Mychart, it is strongly recommended to get this so you can easily view all your results. If you do not have mychart, we will attempt to call you once with normal lab and testing results. If we cannot reach you by phone with normal results, we will send you a letter.  If you have not heard the results of your testing after one week please give us a call.       Your Cardiovascular Medicine [place] Team (Steve, Lisa, Jamie, Melanie, and Breyona Swander)  phone number is [phone].

## 2022-03-29 ENCOUNTER — Encounter: Admit: 2022-03-29 | Discharge: 2022-03-29 | Payer: MEDICARE

## 2022-07-04 ENCOUNTER — Encounter: Admit: 2022-07-04 | Discharge: 2022-07-04 | Payer: MEDICARE

## 2022-07-04 NOTE — Telephone Encounter
Patient was scheduled for a [employer] Carelink on 07/09/2022 that has not been received. Patient was instructed to send a manual transmission. Instructed if the transmitter does not appear to be working properly, they need to contact the device company directly. Patient was provided with that contact number. Requested the patient send [place] a MyChart message or contact our device nurses at [phone] to let us know after they have sent their transmission. Unable to leave message at time of call. BC      Note: Patient needs to send a manual transmission as we did not receive their scheduled remote interrogation.

## 2022-07-16 DEATH — deceased

## 2022-07-25 ENCOUNTER — Encounter: Admit: 2022-07-25 | Discharge: 2022-07-25 | Payer: MEDICARE

## 2022-08-15 DEATH — deceased

## 2024-03-27 IMAGING — CT TRAUMA_HIP_PELVIS(Adult)
2 of 4 series · 11 of 46 positions shown, 12 images · non-contrast
Comparison: none

[Series 2: hip ax 3.00 br60 s3 · axial · 0.60mm/px · z∈[+974,+1238]mm · 8 of 102 slices shown, 9 images]
[im 7/102  soft-tissue]
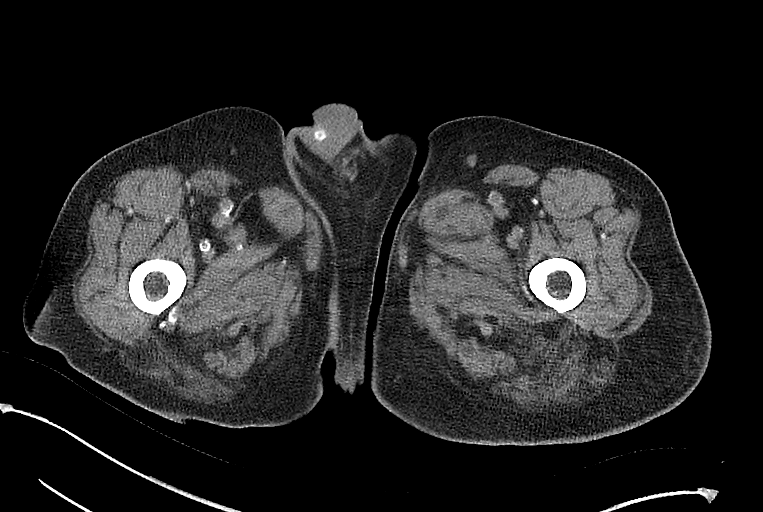
[im 7/102  bone]
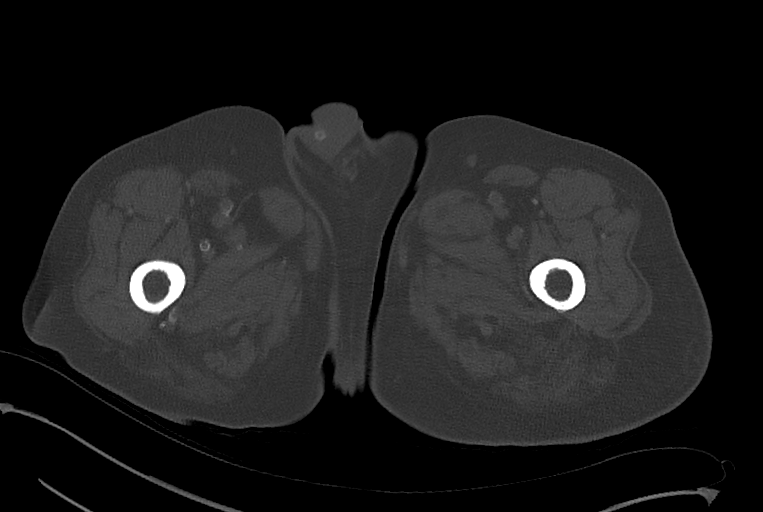
[im 21/102  soft-tissue]
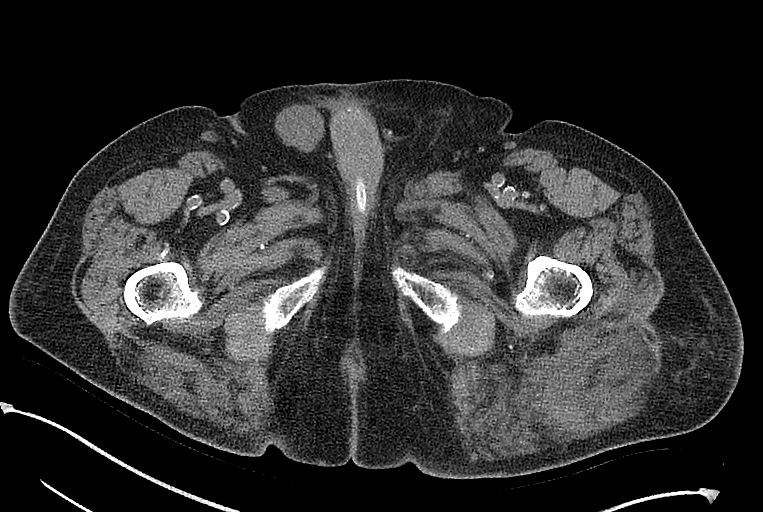
[im 34/102  soft-tissue]
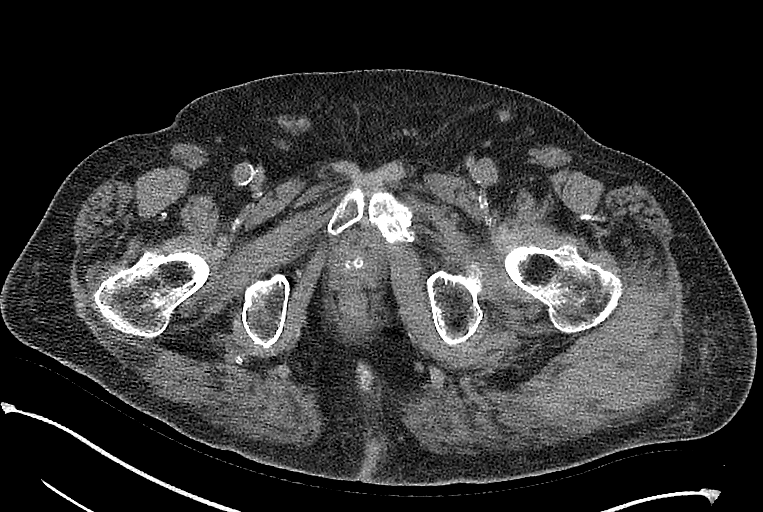
[im 48/102  soft-tissue]
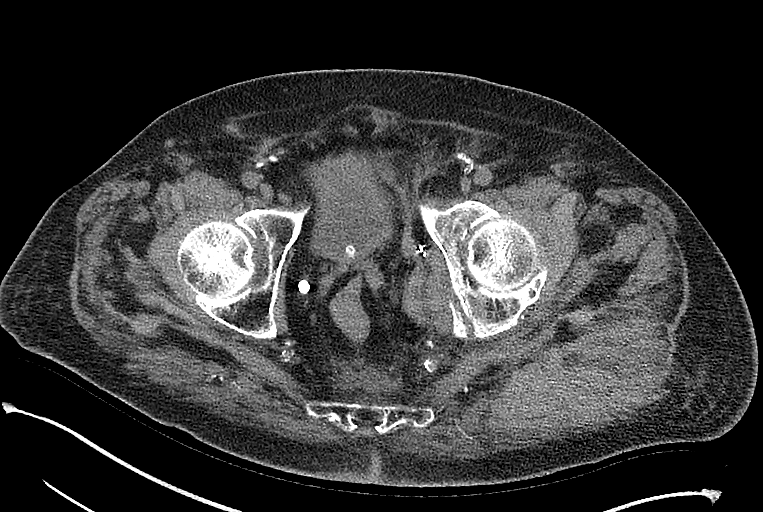
[im 54/102  soft-tissue]
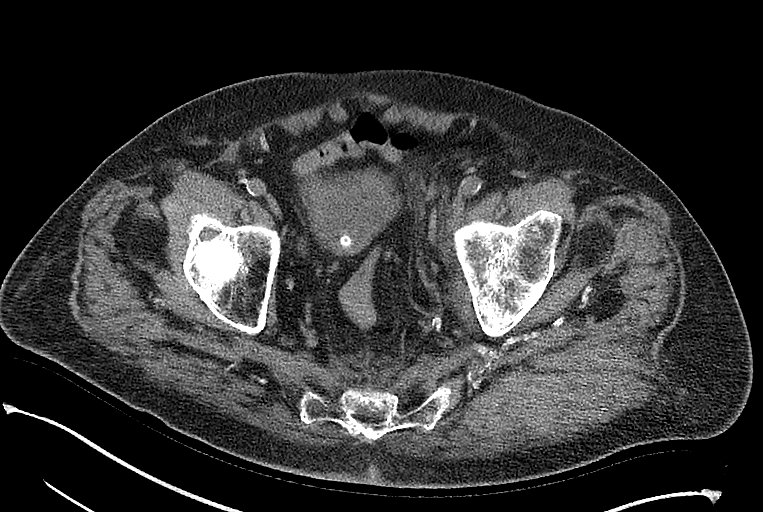
[im 68/102  soft-tissue]
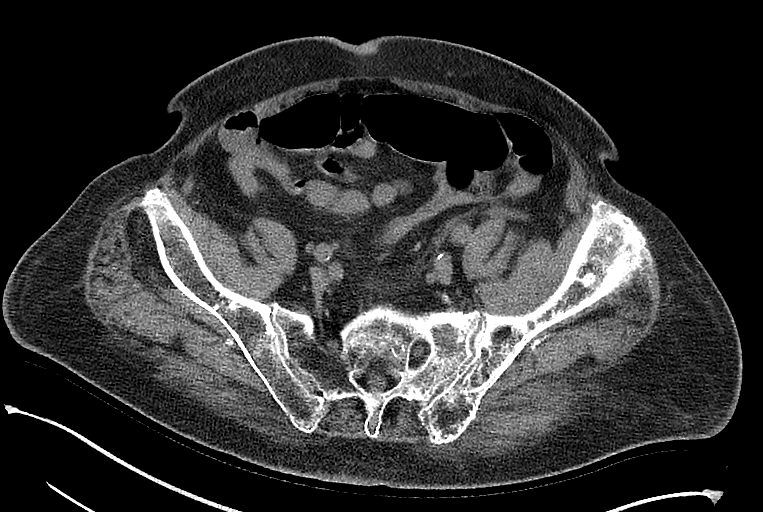
[im 81/102  soft-tissue]
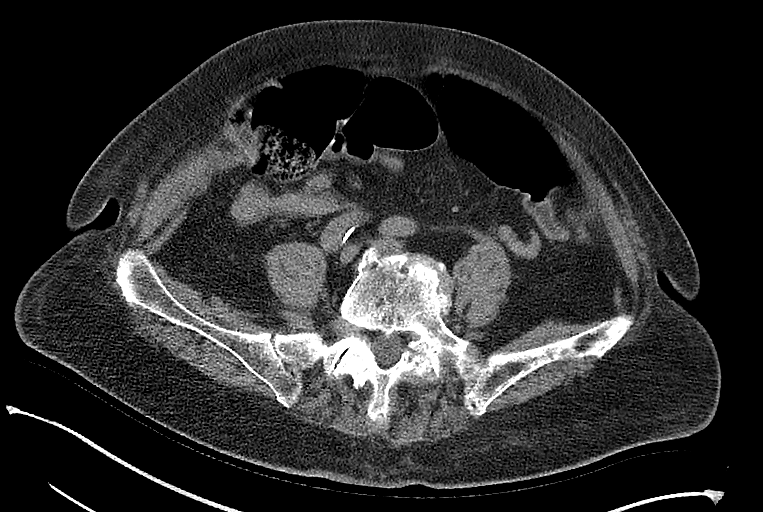
[im 95/102  soft-tissue]
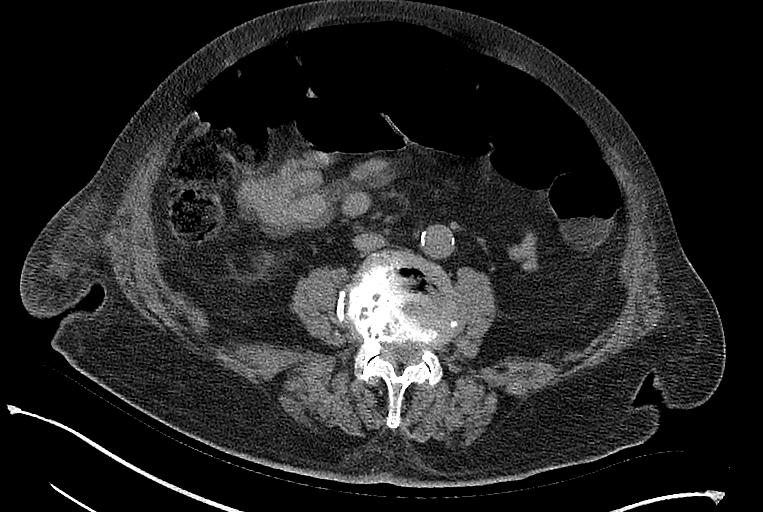

[Series 4: hip cor 3.00 br60 s3 · coronal · 0.60mm/px · 3 of 102 slices shown]
[im 34/102  soft-tissue]
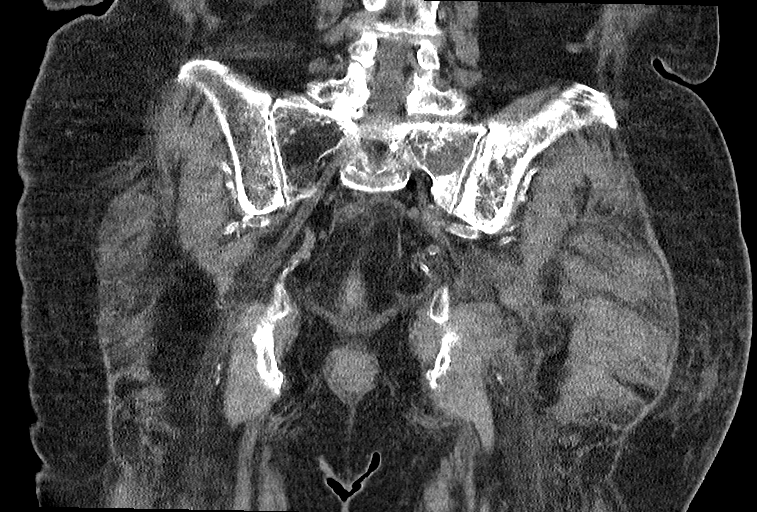
[im 45/102  soft-tissue]
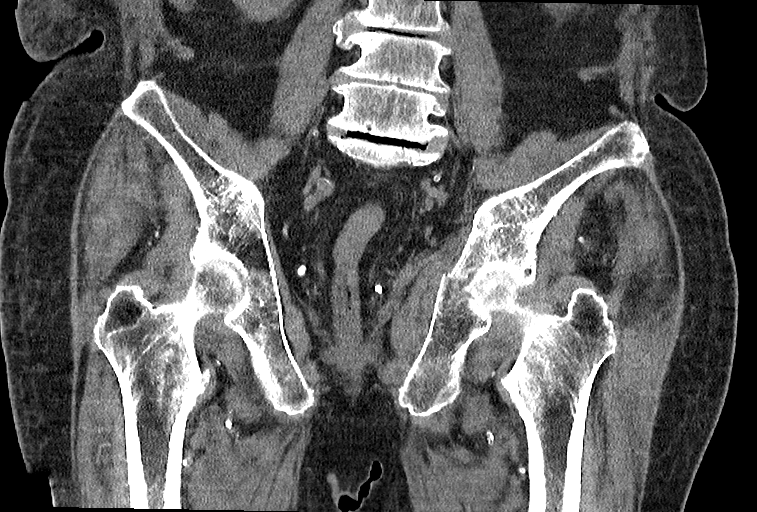
[im 57/102  soft-tissue]
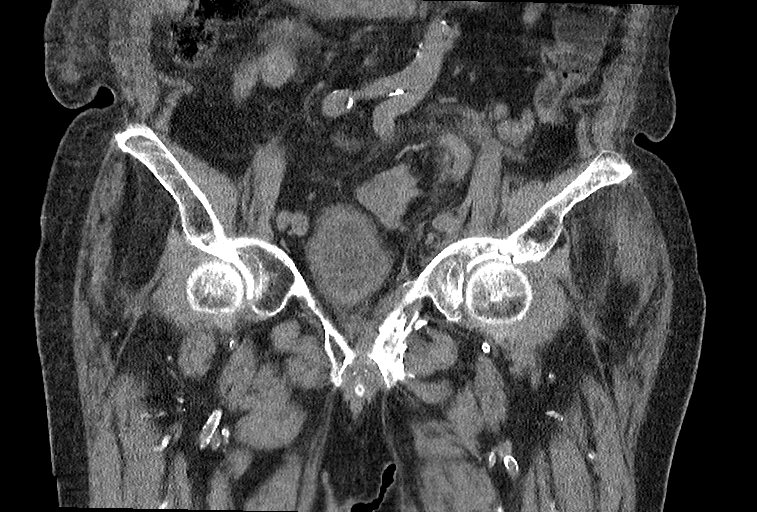

[11 of 46 positions shown; findings below may reference images not displayed]

DIAGNOSTIC STUDIES

EXAM

COMPUTED TOMOGRAPHY PELVIS; WITHOUT CONTRAST MATERIAL

INDICATION

LT HIP PAIN
FALL. C/O LEFT HIP PAIN. CT/NM 5/0. KF/CF/TJ

TECHNIQUE

Contiguous axial tomographic images were obtained without contrast.

All CT scans at this facility use dose modulation, iterative reconstruction, and/or weight based
dosing when appropriate to reduce radiation dose to as low as reasonably achievable.

0 Ct and nuclear scans in the last year.

COMPARISONS

X-ray 11/07/2021.

FINDINGS

Osteoporosis. There is cortical irregularity involving the left inferior pubic ramus. Chronic
appearing periosteal reaction involving the left pubic body. There is an acute appearing fracture
line involving the left pubic body. There is an acute fracture of the anterior wall, posterior wall
and roof of the left acetabulum. Paget's disease the left hemipelvis. The left hip appears grossly
intact. The right hip appears grossly intact. Focal sclerosis involving the left side of the sacrum,
likely secondary to an old fracture. Severe degenerative changes within the lower lumbar spine.
Moderate amount of air within the colon. No definite pneumoperitoneum. Moderate 2 severe
atherosclerotic disease. Foley catheter in place. Diminutive prostate containing coarse
calcifications. There is presacral soft tissue stranding. Left-sided pelvic hematoma adjacent to the
left acetabulum.

IMPRESSION

1. Acute left acetabular fracture.

2. Acute left obturator ring fracture.

Follow up with an orthopedic consultation is needed.

Tech Notes:

FALL. C/O LEFT HIP PAIN. CT/NM 5/0. KF/CF/TJ
